# Patient Record
Sex: Female | Born: 1965 | Race: White | Hispanic: No | Smoking: Current every day smoker
Health system: Southern US, Community
[De-identification: ages and names within clinical notes are randomized; demographics above are authoritative.]

## PROBLEM LIST (undated history)

## (undated) DIAGNOSIS — E785 Hyperlipidemia, unspecified: Secondary | ICD-10-CM

## (undated) DIAGNOSIS — IMO0002 Reserved for concepts with insufficient information to code with codable children: Secondary | ICD-10-CM

## (undated) DIAGNOSIS — R102 Pelvic and perineal pain: Secondary | ICD-10-CM

## (undated) DIAGNOSIS — G8929 Other chronic pain: Secondary | ICD-10-CM

## (undated) DIAGNOSIS — Z8719 Personal history of other diseases of the digestive system: Secondary | ICD-10-CM

## (undated) DIAGNOSIS — R32 Unspecified urinary incontinence: Secondary | ICD-10-CM

## (undated) DIAGNOSIS — M545 Low back pain, unspecified: Secondary | ICD-10-CM

## (undated) DIAGNOSIS — Z8679 Personal history of other diseases of the circulatory system: Secondary | ICD-10-CM

## (undated) DIAGNOSIS — Q600 Renal agenesis, unilateral: Secondary | ICD-10-CM

## (undated) DIAGNOSIS — F329 Major depressive disorder, single episode, unspecified: Secondary | ICD-10-CM

## (undated) DIAGNOSIS — I493 Ventricular premature depolarization: Secondary | ICD-10-CM

## (undated) DIAGNOSIS — Z872 Personal history of diseases of the skin and subcutaneous tissue: Secondary | ICD-10-CM

## (undated) DIAGNOSIS — M48061 Spinal stenosis, lumbar region without neurogenic claudication: Secondary | ICD-10-CM

## (undated) DIAGNOSIS — Z8711 Personal history of peptic ulcer disease: Secondary | ICD-10-CM

## (undated) DIAGNOSIS — K5909 Other constipation: Secondary | ICD-10-CM

## (undated) DIAGNOSIS — F32A Depression, unspecified: Secondary | ICD-10-CM

## (undated) DIAGNOSIS — F419 Anxiety disorder, unspecified: Secondary | ICD-10-CM

## (undated) DIAGNOSIS — R350 Frequency of micturition: Secondary | ICD-10-CM

## (undated) HISTORY — DX: Major depressive disorder, single episode, unspecified: F32.9

## (undated) HISTORY — DX: Low back pain: M54.5

## (undated) HISTORY — DX: Depression, unspecified: F32.A

## (undated) HISTORY — DX: Low back pain, unspecified: M54.50

## (undated) HISTORY — DX: Unspecified urinary incontinence: R32

## (undated) HISTORY — PX: TONSILLECTOMY AND ADENOIDECTOMY: SUR1326

## (undated) HISTORY — DX: Reserved for concepts with insufficient information to code with codable children: IMO0002

## (undated) HISTORY — DX: Other chronic pain: G89.29

## (undated) HISTORY — DX: Anxiety disorder, unspecified: F41.9

## (undated) HISTORY — DX: Renal agenesis, unilateral: Q60.0

---

## 1987-09-20 HISTORY — PX: TUBAL LIGATION: SHX77

## 1995-09-20 HISTORY — PX: BREAST ENHANCEMENT SURGERY: SHX7

## 1996-09-19 HISTORY — PX: NEPHRECTOMY: SHX65

## 1998-02-27 ENCOUNTER — Inpatient Hospital Stay (HOSPITAL_COMMUNITY): Admission: EM | Admit: 1998-02-27 | Discharge: 1998-03-03 | Payer: Self-pay | Admitting: Specialist

## 1998-09-12 ENCOUNTER — Emergency Department (HOSPITAL_COMMUNITY): Admission: EM | Admit: 1998-09-12 | Discharge: 1998-09-12 | Payer: Self-pay | Admitting: Emergency Medicine

## 2002-05-17 ENCOUNTER — Other Ambulatory Visit: Admission: RE | Admit: 2002-05-17 | Discharge: 2002-05-17 | Payer: Self-pay | Admitting: Family Medicine

## 2003-05-16 ENCOUNTER — Ambulatory Visit (HOSPITAL_BASED_OUTPATIENT_CLINIC_OR_DEPARTMENT_OTHER): Admission: RE | Admit: 2003-05-16 | Discharge: 2003-05-16 | Payer: Self-pay | Admitting: Urology

## 2003-05-16 HISTORY — PX: OTHER SURGICAL HISTORY: SHX169

## 2003-08-08 ENCOUNTER — Ambulatory Visit (HOSPITAL_BASED_OUTPATIENT_CLINIC_OR_DEPARTMENT_OTHER): Admission: RE | Admit: 2003-08-08 | Discharge: 2003-08-08 | Payer: Self-pay | Admitting: Urology

## 2003-08-08 HISTORY — PX: OTHER SURGICAL HISTORY: SHX169

## 2006-07-14 ENCOUNTER — Emergency Department (HOSPITAL_COMMUNITY): Admission: EM | Admit: 2006-07-14 | Discharge: 2006-07-14 | Payer: Self-pay | Admitting: Emergency Medicine

## 2007-02-13 ENCOUNTER — Encounter (INDEPENDENT_AMBULATORY_CARE_PROVIDER_SITE_OTHER): Payer: Self-pay | Admitting: Obstetrics and Gynecology

## 2007-02-13 HISTORY — PX: LAPAROSCOPIC ASSISTED VAGINAL HYSTERECTOMY: SHX5398

## 2007-02-14 ENCOUNTER — Inpatient Hospital Stay (HOSPITAL_COMMUNITY): Admission: RE | Admit: 2007-02-14 | Discharge: 2007-02-17 | Payer: Self-pay | Admitting: Obstetrics and Gynecology

## 2007-03-01 ENCOUNTER — Inpatient Hospital Stay (HOSPITAL_COMMUNITY): Admission: AD | Admit: 2007-03-01 | Discharge: 2007-03-02 | Payer: Self-pay | Admitting: Obstetrics and Gynecology

## 2007-09-20 HISTORY — PX: ANTERIOR CRUCIATE LIGAMENT REPAIR: SHX115

## 2008-06-12 ENCOUNTER — Emergency Department (HOSPITAL_COMMUNITY): Admission: EM | Admit: 2008-06-12 | Discharge: 2008-06-12 | Payer: Self-pay | Admitting: Family Medicine

## 2008-07-08 ENCOUNTER — Emergency Department (HOSPITAL_COMMUNITY): Admission: EM | Admit: 2008-07-08 | Discharge: 2008-07-08 | Payer: Self-pay | Admitting: Family Medicine

## 2008-07-15 ENCOUNTER — Emergency Department (HOSPITAL_COMMUNITY): Admission: EM | Admit: 2008-07-15 | Discharge: 2008-07-15 | Payer: Self-pay | Admitting: Emergency Medicine

## 2008-08-19 ENCOUNTER — Emergency Department (HOSPITAL_COMMUNITY): Admission: EM | Admit: 2008-08-19 | Discharge: 2008-08-19 | Payer: Self-pay | Admitting: Family Medicine

## 2009-12-28 ENCOUNTER — Emergency Department (HOSPITAL_BASED_OUTPATIENT_CLINIC_OR_DEPARTMENT_OTHER): Admission: EM | Admit: 2009-12-28 | Discharge: 2009-12-28 | Payer: Self-pay | Admitting: Emergency Medicine

## 2010-04-22 ENCOUNTER — Encounter: Admission: RE | Admit: 2010-04-22 | Discharge: 2010-04-22 | Payer: Self-pay | Admitting: Orthopedic Surgery

## 2011-02-01 NOTE — Discharge Summary (Signed)
Tracy [name], Tracy [name]                 ACCOUNT NO.:  [id number]   MEDICAL RECORD NO.:  [id number]          PATIENT TYPE:  INP   LOCATION:  9306                          FACILITY:  [facility]   PHYSICIAN:  Juluis Mire, M.D.   DATE OF BIRTH:  04-17-1966   DATE OF ADMISSION:  02/28/2007  DATE OF DISCHARGE:  03/02/2007                               DISCHARGE SUMMARY   ADMISSION DIAGNOSES:  Status post laparoscopically assisted vaginal  hysterectomy, anterior/posterior repair and sling procedure with  increasing pelvic discomfort.   DISCHARGE DIAGNOSES:  Status post laparoscopically assisted vaginal  hysterectomy, anterior/posterior repair and sling procedure with  increasing pelvic discomfort, with probably just normal postoperative  discomfort.   For complete history and physical, please see written note.   COURSE IN THE HOSPITAL:  The patient admitted and begun on analgesics.  Her white count was 9300, hemoglobin 10.4.  She was reexamined on the  12th by Dr. Jennette Kettle, found no evidence of hematoma.  Felt this was probably  just normal postoperative discomfort.  On the 13th, she was doing much  better.  She was tolerating a regular diet and ambulating without  difficulty.  She was voiding without difficulty, passing flatus and exam  was not performed.  The patient will be discharged home at this time.  In terms of complication, none were encountered during the stay in the  hospital.  Discharged home in stable condition.   DISPOSITION:  Routine postoperative instructions given.  She is to avoid  heavy lifting, vaginal entrance or driving a car. Watch for signs of  infection, nausea, vomiting, increasing bleeding or increasing pelvic  pain.  She will be followed up Monday in the office by Dr. Vincente Poli.      Juluis Mire, M.D.  Electronically Signed     JSM/MEDQ  D:  03/02/2007  T:  03/02/2007  Job:  [id number]

## 2011-02-04 NOTE — Discharge Summary (Signed)
Tracy [name], Tracy [name]                 ACCOUNT NO.:  [id number]   MEDICAL RECORD NO.:  [id number]          PATIENT TYPE:  INP   LOCATION:  9310                          FACILITY:  [facility]   PHYSICIAN:  Michelle L. Grewal, M.D.DATE OF BIRTH:  12-17-1965   DATE OF ADMISSION:  02/13/2007  DATE OF DISCHARGE:  02/17/2007                               DISCHARGE SUMMARY   ADMISSION DIAGNOSIS:  Uterine prolapse, cystocele with symptomatic  rectocele and stress urinary incontinence.   DISCHARGE DIAGNOSIS:  Uterine prolapse, cystocele with symptomatic  rectocele and stress urinary incontinence.   HISTORY OF PRESENT ILLNESS:  The patient is a 45 year old female who was  admitted with a diagnosis of pelvic prolapse and stress urinary  incontinence.   HOSPITAL COURSE:  She underwent on the day of admission and LAVH and  posterior repair. Martina Sinner, M.D. performed a cystoscopy and  cystocele repair with pubovaginal sling. At surgery EBL was 400 mL.  The  patient did well postop.  On postop day #1 she was ambulating.  She had  fair pain control.  The patient's vital signs were stable.  By postop  day #2 she was having to do in-and-out catheterization because of some  urinary retention and that was being managed by Dr. Sherron Monday. She was  using sitz baths for her perineal pain.  She was having some problem  with abdominal distension by postop day #2 and per her report she states  that she has problems with chronic constipation. Sometimes she can go 1-  2 weeks without a bowel movement.  We tried milk of magnesia, we tried  some Dulcolax suppository, we also tried treating her nausea with  antiemetics. I felt that the nausea was secondary to her distension.  Every time she would eat, she would become slightly distended.  She did  maintain great bowel sounds, however by Feb 16, 2007, I did perform a  KUB because of persistent distension.  The radiologist reviewed the film  which showed minimal  ileus but a lot of stool throughout the entire  length of the colon. We did several Fleet's enema and finally a soapsuds  enema on Feb 17, 2007 performed revealed good results. Her distension  was resolved by Feb 17, 2007, that was postop day #4. She remained  afebrile with stable vital signs.  She was discharged home on postop day  #4 feeling much better.  She received all of her catheter instructions  by Dr. Sherron Monday and she will follow up in my office in 1-2 weeks.  She  was advised to call if she has a return abdominal distension, nausea,  vomiting, temperature greater than 100.5 or heavy vaginal bleeding.      Michelle L. Vincente Poli, M.D.  Electronically Signed     MLG/MEDQ  D:  04/09/2007  T:  04/09/2007  Job:  [id number]

## 2011-02-04 NOTE — Op Note (Signed)
Tracy [name], Tracy [name]                 ACCOUNT NO.:  [id number]   MEDICAL RECORD NO.:  [id number]          PATIENT TYPE:  AMB   LOCATION:  SDC                           FACILITY:  [facility]   PHYSICIAN:  Michelle L. Grewal, M.D.DATE OF BIRTH:  Jan 28, 1966   DATE OF PROCEDURE:  02/13/2007  DATE OF DISCHARGE:                               OPERATIVE REPORT   PREOP DIAGNOSES:  1. Pelvic prolapse.  2. Uterine prolapse.  3. Cystocele.  4. Rectocele.  5. Stress urinary incontinence.   POSTOP DIAGNOSES:  1. Pelvic prolapse.  2. Uterine prolapse.  3. Cystocele.  4. Rectocele.  5. Stress urinary incontinence.   PROCEDURE:  1. Laparoscopically assisted vaginal hysterectomy.  2. Posterior repair.  3. Anterior repair.  4. Cystoscopy.  5. Anterior vaginal sling.   SURGEONS:  Michelle L. Vincente Poli, M.D.  Martina Sinner, MD   ANESTHESIA:  General.   SPECIMENS:  Uterus and cervix.   PROCEDURE:  The patient was taken to the operating room after informed  consent was obtained.  She was consented prior to surgery on the known  and accepted risks of surgery included risks of anesthesia, risks of  infection, risks of internal injury to internal organs, risks of  excessive bleeding, risks of pulmonary embolism, and venous thrombus  embolism.  She was intubated.  She was prepped and draped in the usual  fashion and a Foley catheter was inserted.  A small infraumbilical  incision was made through umbilicus.  The Veress needle was inserted and  pneumoperitoneum was performed.  The Veress needle was removed and the  11 mm trocar was inserted.  The laparoscope was introduced through the  trocar sheet.  The 5 mm suprapubic port was placed under direct  visualization.  The patient was noted to be status post bilateral tubal  ligation.  Her ovaries appeared normal.  The uterus appeared normal.  There were no pelvic adhesions.  I then placed the [facility] instrument  across the triple pedicle on each side  and burned that and carried it  down to the round ligament.  This was done on both sides with no  bleeding.  At this point we then removed the trocar laparoscope and  released the pneumoperitoneum.  I did keep the two trocars in which were  used at the end of the case.  Then attention was turned to the vagina.  A weighted speculum was placed into the vagina and circumferential  incision was made around the cervix.  The posterior cul-de-sac was  entered sharply using Mayo scissors and the anterior cul-de-sac was  entered sharply using Mayo scissors.  Using curved Heaney clamps we  clamped the uterosacral cardinal ligaments on either side.  Each pedicle  were suture ligated using 0 Vicryl suture.  We walked our way up the  broad ligament staying just beside the uterus and cervix.  Each pedicle  was suture ligated using 0 Vicryl suture.  Once we reached the level of  the uterine fundus the uterus was retroflexed.  The remainder of the  broad ligament was clamped on either  side and the specimen was removed.  The pedicles were secured using a suture ligated of 0 Vicryl suture.  At  this point I then placed a McCall stitch in the cul-de-sac using 0  Vicryl suture and then closed the posterior two-thirds of the cuff using  2-0 Vicryl in a running locked stitch.  At this point my assistant, Dr.  Sherron Monday, took over as the primary surgeon and he performed an  anterior repair and cystoscopy and pubovaginal sling.  That will be  dictated by him.  At the end he completed closing the vaginal cuff  completely.  We then switched places and I became primary surgeon again  and at this point I performed the posterior repair by placing the Allis  clamps at the five and seven o'clock position on the perineum making  V  shape incision using the scalpel and making a midline incision all the  way up the posterior vaginal wall using Metzenbaum scissors.  The  rectocele was reduced using sharp and blunt  dissection, and a Vicryl  suture interrupted to reduce the rectocele.  The redundant vaginal  epithelium was closed and then the vagina was then closed in a  continuous running locked sutures in 2-0 Vicryl.  The perineum was  closed as well using subcuticular stitch.  At the end of the procedure a  vaginal packing was placed into the vagina.  At this point I then  changed my gloves and then went back up to the abdomen where the  pneumoperitoneum was then performed again and irrigated the pelvis.  There was no bleeding whatsoever.  I did notice that her vaginal cuff  did appear quite raw and the bowel seemed to want to sit on the vaginal  cuff.  I did place a piece of Interceed along the vaginal cuff to  hopefully prevent adhesions in the future.  At the end of the procedure  the pneumoperitoneum was released.  The skin incisions were closed with  Dermabond.  All sponge, lap, and instrument counts were correct times  two.  The patient went to recovery room in stable condition.      Michelle L. Vincente Poli, M.D.  Electronically Signed     MLG/MEDQ  D:  02/13/2007  T:  02/13/2007  Job:  [id number]   cc:   Martina Sinner, MD  Fax: [phone]

## 2011-02-04 NOTE — Op Note (Signed)
NAME:  [name], [name]                          ACCOUNT NO.:  [id number]   MEDICAL RECORD NO.:  [id number]                   PATIENT TYPE:  AMB   LOCATION:  NESC                                 FACILITY:  [facility]   PHYSICIAN:  Ronald L. Ovidio Hanger, M.D.           DATE OF BIRTH:  [birth year]   DATE OF PROCEDURE:  08/08/2003  DATE OF DISCHARGE:                                 OPERATIVE REPORT   DIAGNOSIS:  Vaginal erosion TVT.   OPERATIVE PROCEDURE:  Revision TVT.   SURGEON:  Lucrezia Starch. Earlene Plater, M.D.   ANESTHESIA:  LMA.   ESTIMATED BLOOD LOSS:  Negligible.   TUBES:  None.   COMPLICATIONS:  None.   INDICATIONS FOR PROCEDURE:  [name] is a lovely 45 year old white female,  who had stress incontinence, underwent TVT procedure.  She did well;  however, there was a slight erosion, and the TVT could be palpated  vaginally, just one margin of it.  It has been somewhat uncomfortable,  although it has been very functional but with the slight discomfort  associated with the palpable edge of the TVT sling, she has elected to  proceed with excision of the palpable sling.   PROCEDURE IN DETAIL:  The patient was placed in supine position.  After  proper LMA anesthesia, was placed in the dorsal lithotomy position and  prepped and draped with Betadine in a sterile fashion.  A 16 French Foley  catheter was inserted in the bladder, and the bladder was drained.  The edge  of the TVT was grasped, could barely be seen coming through the vaginal  mucosa.  It was dissected at approximately, and approximately a 1 cm section  of it was excised, maintaining the lateral margins of it.  There It was a  quite thick vascularized flap noted over it.  Thorough irrigation was  performed, good hemostasis noted to be present.  There was no further  palpable TVT, and the vaginal flap was closed with a running 2-0 Vicryl  suture horizontally.  The wound was irrigated, and the patient was taken to  the recovery room  stable, and the Foley catheter was removed.                                               Ronald L. Ovidio Hanger, M.D.    RLD/MEDQ  D:  08/08/2003  T:  08/08/2003  Job:  [id number]

## 2011-02-04 NOTE — Op Note (Signed)
NAME:  Tracy [name], Tracy [name]                          ACCOUNT NO.:  [id number]   MEDICAL RECORD NO.:  [id number]                   PATIENT TYPE:  AMB   LOCATION:  NESC                                 FACILITY:  [facility]   PHYSICIAN:  Ronald L. Ovidio Hanger, M.D.           DATE OF BIRTH:  [birth year]   DATE OF PROCEDURE:  05/16/2003  DATE OF DISCHARGE:                                 OPERATIVE REPORT   PREOPERATIVE DIAGNOSIS:  Stress urinary incontinence type 1 and type 3.   POSTOPERATIVE DIAGNOSIS:  Stress urinary incontinence type 1 and type 3.   OPERATION:  TVT procedure and cystourethroscopy.   SURGEON:  Lucrezia Starch. Earlene Plater, M.D.   ANESTHESIA:  LMA.   ESTIMATED BLOOD LOSS:  50 mL   TUBES:  None.   COMPLICATIONS:  None.   INDICATIONS FOR PROCEDURE:  Neya is a lovely 45 year old white female who  essentially presented with urinary incontinence. She was found on workup to  have some descensus but not below the inferior border of the pubic symphysis  and on urodynamic evaluation was noted to have stable urodynamics but had a  leak point pressure of 100-110 cm of water. We considered options carefully  and after understanding the risks, benefits, and alternatives, she elected  to proceed with a sling TVT.   DESCRIPTION OF PROCEDURE:  The patient was placed in supine position and  after proper LMA anesthesia was placed in the dorsal lithotomy position and  prepped and draped with Betadine in a sterile fashion. A Deaver retractor  was placed into the vaginal area and a 16 French Foley catheter was placed  in the bladder and the bladder was drained. The vaginal submucosa was  injected with approximately 8 mL of 1% Xylocaine with epinephrine. A 1 cm  midline incision was made at the urethrovesical junction and vaginal flaps  were created bilaterally, the endopelvic fascia was not perforated. Two  punch holes were then obtained one fingerbreadth superior and two  fingerbreadths lateral to the  midline pubic symphysis and utilizing the TVT  needles these were punctured and delivered into through the endopelvic  fascia to the fingertip and delivered periurethrally bilaterally.  Cystourethroscopy was then performed and utilizing both the 12 and 70 degree  lenses, the bladder was carefully inspected and efflux of clear urine was  noted from the solitary functioning ureteral orifice although both orifices  were in normal position and no perforation was noted.  No lesions were  noted. The bladder was drained. The TVT tape was then placed with the  __________ instrument and cystourethroscopy was performed in a similar  manner again, and again no perforation was noted. The taped sheaths were  removed. The tape was placed so that it completely deployed but did not  constrict and was not unduly loose. A hemostat could be placed behind it,  and again  cystourethroscopy revealed no perforations. The bladder was then  drained,  the vaginal mucosa was closed with a running 3-0 Vicryl suture. The tape  were cut and the punch holes were glued with Tegaderm. A vaginal pack was  placed utilizing a vaginal pack with bacitracin ointment and the patient was  taken to the recovery room stable.                                               Ronald L. Ovidio Hanger, M.D.    RLD/MEDQ  D:  05/16/2003  T:  05/16/2003  Job:  [id number]

## 2011-02-04 NOTE — Op Note (Signed)
NAMEMARLEEN, Tracy [name]                 ACCOUNT NO.:  [id number]   MEDICAL RECORD NO.:  [id number]          PATIENT TYPE:  AMB   LOCATION:  SDC                           FACILITY:  [facility]   PHYSICIAN:  Martina Sinner, MD DATE OF BIRTH:  1965/12/30   DATE OF PROCEDURE:  02/13/2007  DATE OF DISCHARGE:                               OPERATIVE REPORT   SURGEON:  Martina Sinner, MD   ASSISTANT:  Michelle L. Vincente Poli, M.D.   PREOPERATIVE DIAGNOSES:  1. Stress urinary incontinence.  2. Cystocele.  3. Rectocele  4. Mild loss of uterine support.   OPERATION:  Sling, cystourethropexy plus cystocele repair (with  rectocele repair and laparoscopic assisted transvaginal hysterectomy  performed by Dr. Marcelle Overlie)   Tracy [name] had symptomatic prolapse with stress urge incontinence.  She  consented to a sling plus cystocele repair in combination with rectocele  repair and hysterectomy.   Dr. Vincente Poli performed a laparoscopic-assisted transvaginal hysterectomy.   DESCRIPTION OF PROCEDURE:  Extra care was taken to position the  patient's legs to minimize the risk of compartment syndrome, neuropathy  and DVT.  Preoperative lab work was normal.  She was given preoperative  antibiotics.   Following the laparoscopic-assisted transvaginal hysterectomy I  performed an anterior repair.  Allis clamps were placed on the cuff.  I  opened the anterior vaginal wall between two Allis clamps and divided  down to approximately 1 cm proximal to the urethral meatus, sharply  dissected the vaginal wall mucosa from the pubocervical fascia.  I  dissected back nearly to the white line bilaterally.  I left thick  pubocervical fascia overlying urethra to prevent extrusion.  I dissected  laterally near the urethrovesical angle.  I did an anterior repair with  two layers of 2-0 Vicryl.  She only had a moderate-sized grade 2  cystocele with minimal lateral defect and a mild central defect.  The  second layer was  really just to strengthen the first layer.  I did not  imbricate the bladder neck.   After the cystocele repair I went on to perform the sling.  I made two  1.5 cm incisions 1.5 cm lateral to the midline one fingerbreadth above  the symphysis pubis.  I delivered down with the bladder empty, the SPARC  needle on top of and along the back of the pubic bone under the pulp of  my left index finger bilaterally.  I cystoscoped the patient.  There was  no injury to the bladder or urethra.  I had cystoscoped her after the  anterior repair and hysterectomy and she had efflux of indigo carmine  from her left ureteral orifice.  She has had a right nephrectomy.  There  was still efflux of indigo carmine from the left ureteral orifice  following the sling.  There was no deflection on the bladder when I  wiggled the needles.   With the bladder empty I attached the [facility] sling and brought it up  through the retropubic space with the sheath in place.  I tensioned over  the fat part of a  Kelly clamp using my usual technique.  I cut below the  blue dots and removed the sheath after irrigating the sheath with  saline.  The sling sat nicely and flat at the at the level of the mid  urethra.  I could hypermobilize in the midline and as well as laterally.  I was happy with the tension and position of the sling.  I wanted to err  on the side of looser than tighter in her case.   I trimmed approximately 3 mm of vaginal wall mucosa bilaterally, more  cephalad.  I did not trim near the urethrovesical angle.  I closed the  fairly thick vaginal wall and pubocervical fascia with running 2-0  Vicryl on a CT-1.  I cut the sling at the level of the skin after depressing the skin.  I  closed the abdominal skin with a 4-0 Vicryl subcuticular followed by  Dermabond.  Foley catheter had been reinserted and was draining well.   The posterior repair was then performed by Dr. Marcelle Overlie.   I am hopeful that Mrs.  Bena's cystocele repair and sling will reach her  treatment goal.           ______________________________  Martina Sinner, MD  Electronically Signed     SAM/MEDQ  D:  02/13/2007  T:  02/13/2007  Job:  [id number]

## 2011-06-20 LAB — WET PREP, GENITAL
Trich, Wet Prep: NONE SEEN
WBC, Wet Prep HPF POC: NONE SEEN
Yeast Wet Prep HPF POC: NONE SEEN
Yeast Wet Prep HPF POC: NONE SEEN

## 2011-06-20 LAB — POCT URINALYSIS DIP (DEVICE)
Bilirubin Urine: NEGATIVE
Glucose, UA: NEGATIVE
Hgb urine dipstick: NEGATIVE
Ketones, ur: NEGATIVE
Nitrite: NEGATIVE
Operator id: 200941
Protein, ur: NEGATIVE
Protein, ur: NEGATIVE
Specific Gravity, Urine: 1.01
Urobilinogen, UA: 0.2
pH: 6.5

## 2011-06-20 LAB — URINE CULTURE

## 2011-07-07 LAB — COMPREHENSIVE METABOLIC PANEL
ALT: 17
AST: 20
Albumin: 3.3 — ABNORMAL LOW
Alkaline Phosphatase: 75
BUN: 8
GFR calc Af Amer: >60
Potassium: 3.7
Sodium: 139
Total Protein: 5.7 — ABNORMAL LOW

## 2011-07-07 LAB — URINALYSIS, ROUTINE W REFLEX MICROSCOPIC
Ketones, ur: 15 — AB
Nitrite: NEGATIVE
Protein, ur: NEGATIVE
Urobilinogen, UA: 0.2

## 2011-07-07 LAB — DIFFERENTIAL
Basophils Relative: 0
Eosinophils Relative: 3
Monocytes Absolute: 0.8 — ABNORMAL HIGH
Monocytes Relative: 8
Neutro Abs: 5.5

## 2011-07-07 LAB — CBC
Platelets: 407 — ABNORMAL HIGH
RDW: 12.6
WBC: 9.3

## 2011-11-30 ENCOUNTER — Encounter (HOSPITAL_COMMUNITY): Payer: Self-pay

## 2011-11-30 ENCOUNTER — Emergency Department (HOSPITAL_COMMUNITY)
Admission: EM | Admit: 2011-11-30 | Discharge: 2011-11-30 | Disposition: A | Discharge status: Home / Self Care | Payer: Self-pay | Source: Home / Self Care | Attending: Emergency Medicine | Admitting: Emergency Medicine

## 2011-11-30 DIAGNOSIS — B9689 Other specified bacterial agents as the cause of diseases classified elsewhere: Secondary | ICD-10-CM

## 2011-11-30 DIAGNOSIS — A499 Bacterial infection, unspecified: Secondary | ICD-10-CM

## 2011-11-30 DIAGNOSIS — I059 Rheumatic mitral valve disease, unspecified: Secondary | ICD-10-CM

## 2011-11-30 DIAGNOSIS — J019 Acute sinusitis, unspecified: Secondary | ICD-10-CM

## 2011-11-30 DIAGNOSIS — N760 Acute vaginitis: Secondary | ICD-10-CM

## 2011-11-30 DIAGNOSIS — J309 Allergic rhinitis, unspecified: Secondary | ICD-10-CM

## 2011-11-30 DIAGNOSIS — N3000 Acute cystitis without hematuria: Secondary | ICD-10-CM

## 2011-11-30 DIAGNOSIS — I341 Nonrheumatic mitral (valve) prolapse: Secondary | ICD-10-CM

## 2011-11-30 LAB — POCT URINALYSIS DIP (DEVICE)
Ketones, ur: NEGATIVE mg/dL
Leukocytes, UA: NEGATIVE
Protein, ur: NEGATIVE mg/dL
pH: 7 (ref 5.0–8.0)

## 2011-11-30 LAB — WET PREP, GENITAL: Trich, Wet Prep: NONE SEEN

## 2011-11-30 MED ORDER — PREDNISONE 5 MG PO KIT: 1.0000 | PACK | Freq: Every day | ORAL | Status: DC

## 2011-11-30 MED ORDER — METOPROLOL TARTRATE 50 MG PO TABS: 25.0000 mg | ORAL_TABLET | Freq: Every day | ORAL | Status: AC

## 2011-11-30 MED ORDER — CEPHALEXIN 500 MG PO CAPS: 500.0000 mg | ORAL_CAPSULE | Freq: Three times a day (TID) | ORAL | Status: AC

## 2011-11-30 MED ORDER — FLUCONAZOLE 150 MG PO TABS: 150.0000 mg | ORAL_TABLET | Freq: Once | ORAL | Status: AC

## 2011-11-30 MED ORDER — METRONIDAZOLE 500 MG PO TABS: 500.0000 mg | ORAL_TABLET | Freq: Two times a day (BID) | ORAL | Status: AC

## 2011-11-30 NOTE — ED Provider Notes (Signed)
Chief Complaint  Patient presents with  . Vaginitis    History of Present Illness:   Tracy [name] is a 46 year old female who is in today for several complaints: Vaginal discharge, nasal congestion, and frequent urination.  She has had a two-week history of vaginal discharge with odor and only slight itching. She has mild pelvic pain. She has a history of bacterial vaginosis. She's status post hysterectomy. She denies any bleeding.  She also has had a two-week history of nasal congestion with yellow-green drainage, sore throat, earache, sinus pressure, headache, and fever up to 101. She has a history of springtime allergies. She also complains of sneezing and itching of the nose. She's been using some outdated Flonase without much improvement.  She also has had a three-day history of urinary frequency and urgency. She denies any dysuria or hematuria. She does have a history of many urinary tract infections in the past.  She also requests a refill on her metoprolol which she takes for mitral valve prolapse. She takes 50 mg tablets one half per day.  Review of Systems:  Other than noted above, the patient denies any of the following symptoms: Systemic:  No fever, chills, sweats, fatigue, or weight loss. GI:  No abdominal pain, nausea, anorexia, vomiting, diarrhea, constipation, melena or hematochezia. GU:  No dysuria, frequency, urgency, hematuria, vaginal discharge, itching, or abnormal vaginal bleeding. Skin:  No rash or itching.   PMFSH:  Past medical history, family history, social history, meds, and allergies were reviewed.  Physical Exam:   Vital signs:  BP 112/72  Pulse 78  Temp(Src) 98.8 F (37.1 C) (Oral)  Resp 19  SpO2 97% General:  Alert, oriented and in no distress. ENT: TMs and canals are normal. Nasal mucosa was congested particularly on the right with a small amount of yellowish drainage. The pharynx was clear. Lungs:  Breath sounds clear and equal bilaterally.  No wheezes,  rales or rhonchi. Heart:  Regular rhythm.  No gallops or murmers. Abdomen:  Soft, flat and non-distended.  No organomegaly or mass.  No tenderness, guarding or rebound.  Bowel sounds normally active. Pelvic exam:  External genitalia unremarkable. Vaginal mucosa was normal with a small amount of homogeneous, white, malodorous discharge. Cervix and uterus were surgically absent. No adnexal masses. She has moderate bilateral adnexal tenderness. Skin:  Clear, warm and dry.  Labs:   Results for orders placed during the hospital encounter of 11/30/11  POCT URINALYSIS DIP (DEVICE)      Component Value Range   Glucose, UA NEGATIVE  NEGATIVE (mg/dL)   Bilirubin Urine NEGATIVE  NEGATIVE    Ketones, ur NEGATIVE  NEGATIVE (mg/dL)   Specific Gravity, Urine 1.020  1.005 - 1.030    Hgb urine dipstick NEGATIVE  NEGATIVE    pH 7.0  5.0 - 8.0    Protein, ur NEGATIVE  NEGATIVE (mg/dL)   Urobilinogen, UA 0.2  0.0 - 1.0 (mg/dL)   Nitrite NEGATIVE  NEGATIVE    Leukocytes, UA NEGATIVE  NEGATIVE      Assessment:   Diagnoses that have been ruled out:  None  Diagnoses that are still under consideration:  None  Final diagnoses:  Bacterial vaginosis  Allergic rhinitis  Acute sinusitis  Acute cystitis  Mitral valve prolapse    Plan:   1.  The following meds were prescribed:   New Prescriptions   CEPHALEXIN (KEFLEX) 500 MG CAPSULE    Take 1 capsule (500 mg total) by mouth 3 (three) times daily.   FLUCONAZOLE (  DIFLUCAN) 150 MG TABLET    Take 1 tablet (150 mg total) by mouth once.   METOPROLOL (LOPRESSOR) 50 MG TABLET    Take 0.5 tablets (25 mg total) by mouth daily.   METRONIDAZOLE (FLAGYL) 500 MG TABLET    Take 1 tablet (500 mg total) by mouth 2 (two) times daily.   PREDNISONE 5 MG KIT    Take 1 kit (5 mg total) by mouth daily after breakfast. Prednisone 5 mg 6 day dosepack.  Take as directed.   2.  The patient was instructed in symptomatic care and handouts were given. 3.  The patient was told to  return if becoming worse in any way, if no better in 3 or 4 days, and given some red flag symptoms that would indicate earlier return.    Reuben Likes, MD 11/30/11 [phone]

## 2011-11-30 NOTE — ED Notes (Signed)
C/o bacterial infection for 1 month +, and URI type syx for similar time frame; NAD

## 2011-11-30 NOTE — Discharge Instructions (Signed)
Allergic Rhinitis Allergic rhinitis is when the mucous membranes in the nose respond to allergens. Allergens are particles in the air that cause your body to have an allergic reaction. This causes you to release allergic antibodies. Through a chain of events, these eventually cause you to release histamine into the blood stream (hence the use of antihistamines). Although meant to be protective to the body, it is this release that causes your discomfort, such as frequent sneezing, congestion and an itchy runny nose.  CAUSES  The pollen allergens may come from grasses, trees, and weeds. This is seasonal allergic rhinitis, or "hay fever." Other allergens cause year-round allergic rhinitis (perennial allergic rhinitis) such as house dust mite allergen, pet dander and mold spores.  SYMPTOMS   Nasal stuffiness (congestion).   Runny, itchy nose with sneezing and tearing of the eyes.   There is often an itching of the mouth, eyes and ears.  It cannot be cured, but it can be controlled with medications. DIAGNOSIS  If you are unable to determine the offending allergen, skin or blood testing may find it. TREATMENT   Avoid the allergen.   Medications and allergy shots (immunotherapy) can help.   Hay fever may often be treated with antihistamines in pill or nasal spray forms. Antihistamines block the effects of histamine. There are over-the-counter medicines that may help with nasal congestion and swelling around the eyes. Check with your caregiver before taking or giving this medicine.  If the treatment above does not work, there are many new medications your caregiver can prescribe. Stronger medications may be used if initial measures are ineffective. Desensitizing injections can be used if medications and avoidance fails. Desensitization is when a patient is given ongoing shots until the body becomes less sensitive to the allergen. Make sure you follow up with your caregiver if problems continue. SEEK  MEDICAL CARE IF:   You develop fever (more than 100.5 F (38.1 C).   You develop a cough that does not stop easily (persistent).   You have shortness of breath.   You start wheezing.   Symptoms interfere with normal daily activities.  Document Released: 05/31/2001 Document Revised: 08/25/2011 Document Reviewed: 12/10/2008 [facility] Patient Information 2012 [place], [place].Bacterial Vaginosis Bacterial vaginosis (BV) is a vaginal infection where the normal balance of bacteria in the vagina is disrupted. The normal balance is then replaced by an overgrowth of certain bacteria. There are several different kinds of bacteria that can cause BV. BV is the most common vaginal infection in women of childbearing age. CAUSES   The cause of BV is not fully understood. BV develops when there is an increase or imbalance of harmful bacteria.   Some activities or behaviors can upset the normal balance of bacteria in the vagina and put women at increased risk including:   Having a new sex partner or multiple sex partners.   Douching.   Using an intrauterine device (IUD) for contraception.   It is not clear what role sexual activity plays in the development of BV. However, women that have never had sexual intercourse are rarely infected with BV.  Women do not get BV from toilet seats, bedding, swimming pools or from touching objects around them.  SYMPTOMS   Grey vaginal discharge.   A fish-like odor with discharge, especially after sexual intercourse.   Itching or burning of the vagina and vulva.   Burning or pain with urination.   Some women have no signs or symptoms at all.  DIAGNOSIS  Your caregiver  must examine the vagina for signs of BV. Your caregiver will perform lab tests and look at the sample of vaginal fluid through a microscope. They will look for bacteria and abnormal cells (clue cells), a pH test higher than 4.5, and a positive amine test all associated with BV.  RISKS AND  COMPLICATIONS   Pelvic inflammatory disease (PID).   Infections following gynecology surgery.   Developing HIV.   Developing herpes virus.  TREATMENT  Sometimes BV will clear up without treatment. However, all women with symptoms of BV should be treated to avoid complications, especially if gynecology surgery is planned. Female partners generally do not need to be treated. However, BV may spread between female sex partners so treatment is helpful in preventing a recurrence of BV.   BV may be treated with antibiotics. The antibiotics come in either pill or vaginal cream forms. Either can be used with nonpregnant or pregnant women, but the recommended dosages differ. These antibiotics are not harmful to the baby.   BV can recur after treatment. If this happens, a second round of antibiotics will often be prescribed.   Treatment is important for pregnant women. If not treated, BV can cause a premature delivery, especially for a pregnant woman who had a premature birth in the past. All pregnant women who have symptoms of BV should be checked and treated.   For chronic reoccurrence of BV, treatment with a type of prescribed gel vaginally twice a week is helpful.  HOME CARE INSTRUCTIONS   Finish all medication as directed by your caregiver.   Do not have sex until treatment is completed.   Tell your sexual partner that you have a vaginal infection. They should see their caregiver and be treated if they have problems, such as a mild rash or itching.   Practice safe sex. Use condoms. Only have 1 sex partner.  PREVENTION  Basic prevention steps can help reduce the risk of upsetting the natural balance of bacteria in the vagina and developing BV:  Do not have sexual intercourse (be abstinent).   Do not douche.   Use all of the medicine prescribed for treatment of BV, even if the signs and symptoms go away.   Tell your sex partner if you have BV. That way, they can be treated, if needed, to  prevent reoccurrence.  SEEK MEDICAL CARE IF:   Your symptoms are not improving after 3 days of treatment.   You have increased discharge, pain, or fever.  MAKE SURE YOU:   Understand these instructions.   Will watch your condition.   Will get help right away if you are not doing well or get worse.  FOR MORE INFORMATION  Division of STD Prevention (DSTDP), Centers for Disease Control and Prevention: SolutionApps.co.za American Social Health Association (ASHA): www.ashastd.org  Document Released: 09/05/2005 Document Revised: 08/25/2011 Document Reviewed: 02/26/2009 [facility] Patient Information 2012 [place], [place].Sinusitis Sinuses are air pockets within the bones of your face. The growth of bacteria within a sinus leads to infection. The infection prevents the sinuses from draining. This infection is called sinusitis. SYMPTOMS  There will be different areas of pain depending on which sinuses have become infected.  The maxillary sinuses often produce pain beneath the eyes.   Frontal sinusitis may cause pain in the middle of the forehead and above the eyes.  Other problems (symptoms) include:  Toothaches.   Colored, pus-like (purulent) drainage from the nose.   Swelling, warmth, and tenderness over the sinus areas may be signs  of infection.  TREATMENT  Sinusitis is most often determined by an exam.X-rays may be taken. If x-rays have been taken, make sure you obtain your results or find out how you are to obtain them. Your caregiver may give you medications (antibiotics). These are medications that will help kill the bacteria causing the infection. You may also be given a medication (decongestant) that helps to reduce sinus swelling.  HOME CARE INSTRUCTIONS   Only take over-the-counter or prescription medicines for pain, discomfort, or fever as directed by your caregiver.   Drink extra fluids. Fluids help thin the mucus so your sinuses can drain more easily.   Applying either moist  heat or ice packs to the sinus areas may help relieve discomfort.   Use saline nasal sprays to help moisten your sinuses. The sprays can be found at your local drugstore.  SEEK IMMEDIATE MEDICAL CARE IF:  You have a fever.   You have increasing pain, severe headaches, or toothache.   You have nausea, vomiting, or drowsiness.   You develop unusual swelling around the face or trouble seeing.  MAKE SURE YOU:   Understand these instructions.   Will watch your condition.   Will get help right away if you are not doing well or get worse.  Document Released: 09/05/2005 Document Revised: 08/25/2011 Document Reviewed: 04/04/2007 [facility] Patient Information 2012 [place], [place].  How to Quit Smoking  According to the U.S. Surgeon General, about 440,000 people in the [place] alone die from complications related to tobacco use.  More deaths occur due to cigarette smoking than illegal drug use, AIDS, car accidents, alcohol-related deaths, suicide and homicide combined.  Smoking accounts for about 30% of all cancer related deaths, including more than 80% of lung cancer deaths. Smoking has also been linked as the cause of many other diseases like heart disease, bronchitis, emphysema, stroke, and complications of pneumonia as well as causing an increased risk of miscarriage, premature births, stillbirth, infant death, and low birth weight in infants.  For this reason, the U.S. Surgeon General recommends:  "Smoking cessation (stopping smoking) represents the single most important step that smokers can take to enhance the length and quality of their lives."  Why is it so hard to Quit?  Tobacco products contain Nicotine which is highly addictive - probably as addictive as heroin or cocaine.  Over time, your body becomes both physically and psychologically dependent on it. Finally, attempts to quit smoking are complicated by withdrawal reactions like depression, irritability, trouble sleeping,  trouble concentrating, restlessness, headaches, weight gain, and excessive fatigue as well as a lack of support.  These symptoms can last from a few days to several weeks.  Why should you Quit?  Live longer and healthier  Can improves the health of your housemates (children, spouse)  Increases your energy and breathing ability  Lowers risk of heart attack, stroke and cancer  Saves money - For example, if you smoke a pack of cigarettes a day and each pack costs about $3.00, then you will save about $1,100.00 per year, about $5,500. in 5 years and $11,000.00 in 10 years.  What you can do: 1. Talk to your health care provider - There are many smoking cessations aids available, both prescription or over-the-counter.  Check with your doctor and pharmacist before taking any of these products to see which one is best for you.  Develop a plan with your healthcare provider which may include nicotine replacement, prescription medication and/or counseling.  2. Get Started -  Loraine Leriche a start date on your calendar.  Remove cigarettes and ashtrays from your home, car, and office.  Don't be around other smokers.  Stop smoking.not even a puff!  3. Support - Talk to family, friends, and co-workers about your plan to stop smoking.  Ask them not to smoke around you.  4. Coping Strategies  The four "A"s to help during tough times:  a. Avoid - Avoid other smokers or places where smoking is commonplace.  Avoid alcoholic beverages as these may increase your desire to smoke b. Alter - Change your routine.  Drink water/juices instead of alcohol or coffee.  Take a walk or visit with someone during your coffee break.  Change your route to work. c. Alternatives - Substitute raw vegetables like carrot sticks or celery, sugarless candy or gum for the habit of having a cigarette. d. Activities - Start an exercise program (talk to your doctor prior to beginning any exercise program). Try out a new "hands on" hobby to  distract you from smoking and to keep your hands busy like woodworking or needlepoint.   Additional tips for specific withdrawal symptoms:   Cravings for tobacco:  -- Distract yourself       -- Deep-breathing exercises       -- Remember that cravings are brief    Irritability:   -- Take a few slow, deep breaths      -- Soak in a hot bath    Insomnia:   -- Take a walk several hours before bedtime      -- Avoid caffeinated beverages after noon      -- Read a book      -- Take a warm bath      -- Banana or warm milk    Increased appetite:  -- Drink water or low-calorie drinks      -- Make a survival Kit: Include straws, cinnamon        sticks,       -- coffee stirrers, licorice, toothpicks, gum, or fresh         vegetables    Inability to concentrate: -- Take a brisk walk      -- Lighten your schedule for a couple of days      -- Take more breaks   Fatigue:   -- Get a good night's sleep      -- Take a nap      -- Don't overdo it for 2-4 weeks    Constipation, gas,  stomach pain:   -- Drink plenty of fluids      -- Increase fiber: fruit, raw vegetables, whole grain        cereals      -- Talk to your doctor about diet changes    5. Dealing with Relapses - Most relapses occur within the first 2-3 months.  This is common so don't be discouraged.  Some people may take several attempts before they can quit smoking completely.  6. Reward yourself - Set-up a rewards program for every milestone, like 1st month after quitting, 3rd month after quitting, and 6th month after quitting to keep you motivated.  What your doctor can do: Perform a physical exam and order diagnostic tests like laboratory blood work and a chest X-ray.  This will help to identify health related conditions that might benefit from smoking cessation. Review your health history to make sure there are no contraindications with specific smoking cessation aids like allergies to medications or ingredients in  these  medications or conflicts with your current medications. Prescribe smoking cessation aids such as: Over-the-counter aid:  Nicotine gum, Nicotine patch Prescription aids:  Nicotine spray, Nicotine inhaler, or Bupropion SR (non-nicotine). Offer or recommend individual or group counseling to support you during the initial quitting and maintenance phase of your smoking cessation program. Offer or recommend other alternative treatments like hypnosis or acupuncture.  What you can expect: Benefits from quitting smoking:  Improved Physical Appearance - Minimizes or stops premature wrinkling of skin, bad breath, stained teeth, gum disease, clothes/hair "smoke" odors, and yellow fingernails  Improved Daily Activities - Food tastes better, sense of smell improves, and decreases shortness of breath during ordinary activities like climbing stairs, walking, and performing light housework  Decreased Financial Cost - From both no longer purchasing cigarettes and the health care cost for medical treatment of conditions caused by smoking.  Health of Others - Decreases risk of exposing others to the effects of second hand smoke.  Sets an example for youth. Benefits of Quitting according to research from the U.S. Surgeon General:  20 minutes after:  Blood pressure lowers & Body temperature normalizes  8 hours after: Carbon monoxide levels begins to normalize   24 hours after: Heart attack risk decreases  2 weeks to 3 months after:  Blood flow improves & lung function increases   1 to 9 months after:  Coughing, sinus congestion, fatigue, shortness of breath decrease   1 year after: Risk of developing coronary heart disease is half that of a smoker  5 years after: Risk of a stroke decreases to that of a nonsmoker  10 years after: Risk of death due to lung cancer death is halved.  Risk of oral, throat, esophagus, bladder, kidney, and pancreatic cancer decreases.  15 years after: Risk of developing  coronary heart disease is half that of a nonsmoker      References:  Here are references that can provide additional information and support:  American Cancer Society     American Heart Association (800) ACS-2345       (800) F1561943 or (800) 970 868 3208 www.cancer.org       www.amhrt.Water engineer Academy of Medical Acupuncture 573-053-0844 or 872-152-2827  (800(319) 482-1424 or 878-601-4449 www.lungusa.org       www.medicalacupuncture.org  National [employer] on Smoking & Health Cancer Information Service    Centers for Disease Control and Prevention (800) 4-CANCER or (800) Z5131811  424-534-4774 www.cancer.gov       UEarly.se  Nicotine Anonymous      Smokefree.gov 571-681-6945) TRY-NICA (219)564-7187)   919-530-8724) 44U-QUIT (479) 433-5628) www.nicotine-anonymous.org   www.smokefree.gov  Contact your doctor or pharmacist if you have specific questions about starting a smoking cessation program.    Also, use Allegra D and Nasalcrom for allergy symptoms.

## 2012-09-19 LAB — HM COLONOSCOPY: HM COLON: NORMAL

## 2013-07-20 LAB — HM MAMMOGRAPHY: HM MAMMO: NORMAL

## 2013-07-30 ENCOUNTER — Encounter (HOSPITAL_COMMUNITY): Payer: Self-pay | Admitting: Emergency Medicine

## 2013-07-30 ENCOUNTER — Emergency Department (HOSPITAL_COMMUNITY)
Admission: EM | Admit: 2013-07-30 | Discharge: 2013-07-30 | Disposition: A | Discharge status: Home / Self Care | Payer: BC Managed Care – PPO | Attending: Emergency Medicine | Admitting: Emergency Medicine

## 2013-07-30 ENCOUNTER — Emergency Department (HOSPITAL_COMMUNITY): Payer: BC Managed Care – PPO

## 2013-07-30 DIAGNOSIS — S8263XA Displaced fracture of lateral malleolus of unspecified fibula, initial encounter for closed fracture: Secondary | ICD-10-CM | POA: Insufficient documentation

## 2013-07-30 DIAGNOSIS — Y9289 Other specified places as the place of occurrence of the external cause: Secondary | ICD-10-CM | POA: Insufficient documentation

## 2013-07-30 DIAGNOSIS — X500XXA Overexertion from strenuous movement or load, initial encounter: Secondary | ICD-10-CM | POA: Insufficient documentation

## 2013-07-30 DIAGNOSIS — S8262XA Displaced fracture of lateral malleolus of left fibula, initial encounter for closed fracture: Secondary | ICD-10-CM

## 2013-07-30 DIAGNOSIS — Y9389 Activity, other specified: Secondary | ICD-10-CM | POA: Insufficient documentation

## 2013-07-30 DIAGNOSIS — W01119A Fall on same level from slipping, tripping and stumbling with subsequent striking against unspecified sharp object, initial encounter: Secondary | ICD-10-CM | POA: Insufficient documentation

## 2013-07-30 DIAGNOSIS — Z79899 Other long term (current) drug therapy: Secondary | ICD-10-CM | POA: Insufficient documentation

## 2013-07-30 DIAGNOSIS — Z8679 Personal history of other diseases of the circulatory system: Secondary | ICD-10-CM | POA: Insufficient documentation

## 2013-07-30 MED ORDER — OXYCODONE-ACETAMINOPHEN 5-325 MG PO TABS
2.0000 | ORAL_TABLET | Freq: Once | ORAL | Status: AC
  Administered 2013-07-30: 2 via ORAL
  Filled 2013-07-30: qty 2

## 2013-07-30 MED ORDER — OXYCODONE-ACETAMINOPHEN 5-325 MG PO TABS
1.0000 | ORAL_TABLET | ORAL | Status: DC | PRN
Start: 2013-07-30 — End: 2013-12-13

## 2013-07-30 NOTE — Progress Notes (Signed)
Orthopedic Tech Progress Note Patient Details:  Tracy [name] 11/23/1965 [id number]  Ortho Devices Type of Ortho Device: Stirrup splint;Post (short leg) splint;Crutches Ortho Device/Splint Interventions: Application   Cammer, Mickie Bail 07/30/2013, 5:29 PM

## 2013-07-30 NOTE — ED Notes (Signed)
Family brought to room.

## 2013-07-30 NOTE — ED Provider Notes (Signed)
CSN: [id number]     Arrival date & time 07/30/13  1517 History   First MD Initiated Contact with Patient 07/30/13 1603    This chart was scribed for Tracy Forth PA-C, a non-physician practitioner working with Tracy Skeens, MD by Tracy [name], ED Scribe. This patient was seen in room TR11C/TR11C and the patient's care was started at 5:11 PM     Chief Complaint  Patient presents with  . Foot Pain   (Consider location/radiation/quality/duration/timing/severity/associated sxs/prior Treatment) The history is provided by the patient. No language interpreter was used.   HPI Comments:  Tracy [name] is a 47 y.o. female who presents to the Emergency Department PMHx bilateral ACL repair complaining of constant severe left ankle pain radiating up to left lateral leg at about mid-shin onset acute PTA after mechanical fall on a sidewalk. States she inverted her left ankle during fall. Describes pain as 8/10 in severity. Reports associated constant mild paresthesias of toes. Reports pain is exacerbated by movement and ice. Denies any alleviating factors. Denies associated head injury, and other injuries.    Past Medical History  Diagnosis Date  . Mitral valve disorder    History reviewed. No pertinent past surgical history. History reviewed. No pertinent family history. History  Substance Use Topics  . Smoking status: Never Smoker   . Smokeless tobacco: Not on file  . Alcohol Use: Yes     Comment: occ   OB History   Grav Para Term Preterm Abortions TAB SAB Ect Mult Living                 Review of Systems  Constitutional: Negative for fever and chills.  Gastrointestinal: Negative for nausea and vomiting.  Musculoskeletal: Positive for arthralgias and joint swelling. Negative for back pain, neck pain and neck stiffness.  Skin: Negative for wound.  Neurological: Negative for numbness.  Hematological: Does not bruise/bleed easily.  Psychiatric/Behavioral: The patient is not  nervous/anxious.   All other systems reviewed and are negative.  A complete 10 system review of systems was obtained and all systems are negative except as noted in the HPI and PMHx.     Allergies  Review of patient's allergies indicates no known allergies.  Home Medications   Current Outpatient Rx  Name  Route  Sig  Dispense  Refill  . metoprolol (LOPRESSOR) 50 MG tablet   Oral   Take 50 mg by mouth at bedtime.          Marland Kitchen oxyCODONE-acetaminophen (PERCOCET/ROXICET) 5-325 MG per tablet   Oral   Take 1-2 tablets by mouth every 4 (four) hours as needed for severe pain.   15 tablet   0    BP 107/66  Pulse 81  Temp(Src) 97.6 F (36.4 C) (Oral)  Resp 24  SpO2 99% Physical Exam  Nursing note and vitals reviewed. Constitutional: She appears well-developed and well-nourished. No distress.  HENT:  Head: Normocephalic and atraumatic.  Eyes: Conjunctivae are normal.  Neck: Normal range of motion.  Cardiovascular: Normal rate, regular rhythm, normal heart sounds and intact distal pulses.   No murmur heard. Pulses:      Dorsalis pedis pulses are 2+ on the left side.       Posterior tibial pulses are 2+ on the left side.  Capillary refill less than 3 seconds  Pulmonary/Chest: Effort normal and breath sounds normal. No respiratory distress.  Musculoskeletal: She exhibits tenderness. She exhibits no edema.       Left ankle: She exhibits  decreased range of motion. Tenderness. Lateral malleolus tenderness found. Achilles tendon normal.  Left ankle:  Tenderness and mild swelling over lateral malleolus  Decreased ROM of left ankle secondary to pain  Cap refill <3 seconds Left knee and hip of affected ankle joint has normal ROM   Neurological: She is alert. No sensory deficit. Coordination normal.  Left AJ 3/5 strength decreased secondary to pain  Sensation intact to dull and sharp  Skin: Skin is warm and dry. She is not diaphoretic.  No tenting of the skin  Psychiatric: She has  a normal mood and affect.    ED Course  Procedures (including critical care time) COORDINATION OF CARE:  Nursing notes reviewed. Vital signs reviewed. Initial pt interview and examination performed.   5:11 PM-Discussed work up plan with pt at bedside, which includes left ankle x-ray. Pt agrees with plan. 5:11 PM Nursing Notes Reviewed/ Care Coordinated Applicable Imaging Reviewed and incorporated into ED treatment Discussed results and treatment plan with pt. Pt demonstrates understanding and agrees with plan to f/u with orthopedist.   Treatment plan initiated: Medications  oxyCODONE-acetaminophen (PERCOCET/ROXICET) 5-325 MG per tablet 2 tablet (2 tablets Oral Given 07/30/13 1644)     Initial diagnostic testing ordered.    Labs Review Labs Reviewed - No data to display Imaging Review Dg Ankle Complete Left  07/30/2013   CLINICAL DATA:  Lateral ankle pain after fall.  EXAM: LEFT ANKLE COMPLETE - 3+ VIEW  COMPARISON:  None.  FINDINGS: There is an acute, nondisplaced and transversely oriented fracture through the lateral malleolus, with adjacent soft tissue swelling. Ankle joint is located. The distal tibia is intact. No additional fracture is seen. There are postsurgical changes of the 1st metatarsal.  IMPRESSION: Acute nondisplaced transverse fracture of the lateral malleolus with adjacent soft tissue swelling.   Electronically Signed   By: Britta Mccreedy M.D.   On: 07/30/2013 16:05    EKG Interpretation   None       MDM   1. Closed low lateral malleolus fracture, left, initial encounter     [name] presents with ankle pain after fall.  Patient X-Ray with acute, nondisplaced transverse fracture of the lateral malleolus.  I personally reviewed the imaging tests through PACS system.  I reviewed available ER/hospitalization records through the EMR.   Pain managed in ED. Pt advised to follow up with orthopedics for further evaluation and treatment.  Pain managed in the  department. Patient given  Short-leg splint while in ED, conservative therapy recommended and discussed. Patient will be dc home & is agreeable with above plan. I have also discussed reasons to return immediately to the ER.  Patient expresses understanding and agrees with plan.  It has been determined that no acute conditions requiring further emergency intervention are present at this time. The patient/guardian have been advised of the diagnosis and plan. We have discussed signs and symptoms that warrant return to the ED, such as changes or worsening in symptoms.   Vital signs are stable at discharge.   BP 107/66  Pulse 81  Temp(Src) 97.6 F (36.4 C) (Oral)  Resp 24  SpO2 99%  Patient/guardian has voiced understanding and agreed to follow-up with the PCP or specialist.    I personally performed the services described in this documentation, which was scribed in my presence. The recorded information has been reviewed and is accurate.    Dahlia Client Joshoa Shawler, PA-C 07/30/13 1711

## 2013-07-30 NOTE — ED Notes (Signed)
Pt c/o left ankle pain after falling and twisting ankle; swelling noted and CMS intact

## 2013-07-31 NOTE — ED Provider Notes (Signed)
Medical screening examination/treatment/procedure(s) were performed by non-physician practitioner and as supervising physician I was immediately available for consultation/collaboration.  EKG Interpretation   None         Tracy [name] M Hermina Barnard, MD 07/31/13 0104 

## 2013-11-04 ENCOUNTER — Ambulatory Visit: Payer: BC Managed Care – PPO | Admitting: Family

## 2013-11-11 ENCOUNTER — Ambulatory Visit: Payer: BC Managed Care – PPO | Admitting: Family

## 2013-12-13 ENCOUNTER — Encounter: Payer: Self-pay | Admitting: Family

## 2013-12-13 ENCOUNTER — Ambulatory Visit (INDEPENDENT_AMBULATORY_CARE_PROVIDER_SITE_OTHER): Payer: BC Managed Care – PPO | Admitting: Family

## 2013-12-13 VITALS — BP 110/70 | HR 58 | Temp 98.0°F | Resp 16 | Ht 67.0 in | Wt 153.1 lb

## 2013-12-13 DIAGNOSIS — IMO0002 Reserved for concepts with insufficient information to code with codable children: Secondary | ICD-10-CM

## 2013-12-13 DIAGNOSIS — Z8719 Personal history of other diseases of the digestive system: Secondary | ICD-10-CM

## 2013-12-13 DIAGNOSIS — M545 Low back pain, unspecified: Secondary | ICD-10-CM

## 2013-12-13 DIAGNOSIS — Q600 Renal agenesis, unilateral: Secondary | ICD-10-CM

## 2013-12-13 DIAGNOSIS — F329 Major depressive disorder, single episode, unspecified: Secondary | ICD-10-CM

## 2013-12-13 DIAGNOSIS — G8929 Other chronic pain: Secondary | ICD-10-CM

## 2013-12-13 DIAGNOSIS — I341 Nonrheumatic mitral (valve) prolapse: Secondary | ICD-10-CM | POA: Insufficient documentation

## 2013-12-13 DIAGNOSIS — Q605 Renal hypoplasia, unspecified: Secondary | ICD-10-CM

## 2013-12-13 DIAGNOSIS — F3289 Other specified depressive episodes: Secondary | ICD-10-CM

## 2013-12-13 DIAGNOSIS — I059 Rheumatic mitral valve disease, unspecified: Secondary | ICD-10-CM

## 2013-12-13 DIAGNOSIS — K279 Peptic ulcer, site unspecified, unspecified as acute or chronic, without hemorrhage or perforation: Secondary | ICD-10-CM

## 2013-12-13 DIAGNOSIS — Z872 Personal history of diseases of the skin and subcutaneous tissue: Secondary | ICD-10-CM

## 2013-12-13 DIAGNOSIS — Z8711 Personal history of peptic ulcer disease: Secondary | ICD-10-CM

## 2013-12-13 DIAGNOSIS — Q602 Renal agenesis, unspecified: Secondary | ICD-10-CM

## 2013-12-13 DIAGNOSIS — F32A Depression, unspecified: Secondary | ICD-10-CM | POA: Insufficient documentation

## 2013-12-13 LAB — CBC WITH DIFFERENTIAL/PLATELET
Basophils Absolute: 0 10*3/uL (ref 0.0–0.1)
Basophils Relative: 0 % (ref 0–1)
Eosinophils Absolute: 0.1 10*3/uL (ref 0.0–0.7)
Eosinophils Relative: 2 % (ref 0–5)
HCT: 41.2 % (ref 36.0–46.0)
HEMOGLOBIN: 13.9 g/dL (ref 12.0–15.0)
LYMPHS ABS: 2.3 10*3/uL (ref 0.7–4.0)
LYMPHS PCT: 45 % (ref 12–46)
MCH: 30.4 pg (ref 26.0–34.0)
MCHC: 33.7 g/dL (ref 30.0–36.0)
MCV: 90.2 fL (ref 78.0–100.0)
MONOS PCT: 11 % (ref 3–12)
Monocytes Absolute: 0.6 10*3/uL (ref 0.1–1.0)
NEUTROS ABS: 2.1 10*3/uL (ref 1.7–7.7)
NEUTROS PCT: 42 % — AB (ref 43–77)
PLATELETS: 266 10*3/uL (ref 150–400)
RBC: 4.57 MIL/uL (ref 3.87–5.11)
RDW: 13.2 % (ref 11.5–15.5)
WBC: 5 10*3/uL (ref 4.0–10.5)

## 2013-12-13 LAB — BASIC METABOLIC PANEL
BUN: 10 mg/dL (ref 6–23)
CHLORIDE: 104 meq/L (ref 96–112)
CO2: 27 mEq/L (ref 19–32)
CREATININE: 0.94 mg/dL (ref 0.50–1.10)
Calcium: 9.8 mg/dL (ref 8.4–10.5)
Glucose, Bld: 80 mg/dL (ref 70–99)
Potassium: 4.4 mEq/L (ref 3.5–5.3)
Sodium: 139 mEq/L (ref 135–145)

## 2013-12-13 NOTE — Assessment & Plan Note (Signed)
Follows with [facility]. Glennon HamiltonHoller at [facility].

## 2013-12-13 NOTE — Patient Instructions (Addendum)
Stop goody powder and stop advil. You will be contacted about your referral to the therapist.  Please let us know if you have not heard back within 1 week about your referral. Please complete lab work prior to leaving.  Please schedule fasting physical at the front desk.

## 2013-12-13 NOTE — Assessment & Plan Note (Signed)
Follows with pain management Dr. Silver HugueninSarah Ferraby. Maintained on norco- defer med management to pain management.

## 2013-12-13 NOTE — Assessment & Plan Note (Signed)
Clinically stable. Follows with Dr. Garner Nashaniels at [facility].
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
## 2013-12-13 NOTE — Assessment & Plan Note (Addendum)
Reports nightly use of Advil PM and Goody PM  Due to insomnia. Advised pt to d/c motrin and aspirin and instead take 1 tablet of benadryl as needed for sleep.  Will check CBC to assess for anemia. She is followed by GI.  Consider addition of PPI next visit for GI protection.

## 2013-12-13 NOTE — Assessment & Plan Note (Signed)
We discussed importance of avoiding NSAIDS due to risk of kidney injury.

## 2013-12-13 NOTE — Progress Notes (Signed)
Pre visit review using our clinic review tool, if applicable. No additional management support is needed unless otherwise documented below in the visit note. 

## 2013-12-13 NOTE — Assessment & Plan Note (Signed)
We discussed addition of SSRI, she declines.  She is agreeable to meet with a therapist which I think would really help her. Will place referral.

## 2013-12-30 ENCOUNTER — Ambulatory Visit (INDEPENDENT_AMBULATORY_CARE_PROVIDER_SITE_OTHER): Payer: BC Managed Care – PPO | Admitting: Family

## 2013-12-30 ENCOUNTER — Encounter: Payer: Self-pay | Admitting: Family

## 2013-12-30 VITALS — BP 98/64 | HR 67 | Temp 97.8°F | Resp 16 | Ht 67.0 in | Wt 152.0 lb

## 2013-12-30 DIAGNOSIS — Z0000 Encounter for general adult medical examination without abnormal findings: Secondary | ICD-10-CM | POA: Insufficient documentation

## 2013-12-30 DIAGNOSIS — F3289 Other specified depressive episodes: Secondary | ICD-10-CM

## 2013-12-30 DIAGNOSIS — Z23 Encounter for immunization: Secondary | ICD-10-CM

## 2013-12-30 DIAGNOSIS — F329 Major depressive disorder, single episode, unspecified: Secondary | ICD-10-CM

## 2013-12-30 DIAGNOSIS — F32A Depression, unspecified: Secondary | ICD-10-CM

## 2013-12-30 LAB — LIPID PANEL
CHOL/HDL RATIO: 3 ratio
CHOLESTEROL: 215 mg/dL — AB (ref 0–200)
HDL: 72 mg/dL (ref >39)
LDL CALC: 126 mg/dL — AB (ref 0–99)
TRIGLYCERIDES: 86 mg/dL (ref <150)
VLDL: 17 mg/dL (ref 0–40)

## 2013-12-30 LAB — TSH: TSH: 2.5 u[IU]/mL (ref 0.350–4.500)

## 2013-12-30 LAB — HEPATIC FUNCTION PANEL
ALT: 11 U/L (ref 0–35)
AST: 14 U/L (ref 0–37)
Albumin: 4.4 g/dL (ref 3.5–5.2)
Alkaline Phosphatase: 78 U/L (ref 39–117)
BILIRUBIN TOTAL: 0.3 mg/dL (ref 0.2–1.2)
Total Protein: 6.6 g/dL (ref 6.0–8.3)

## 2013-12-30 LAB — HIV ANTIBODY (ROUTINE TESTING W REFLEX): HIV: NONREACTIVE

## 2013-12-30 NOTE — Assessment & Plan Note (Signed)
Continue healthy diet, exercise. Obtain fasting labs.  Tdap today.

## 2013-12-30 NOTE — Progress Notes (Signed)
Pre visit review using our clinic review tool, if applicable. No additional management support is needed unless otherwise documented below in the visit note. 
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
## 2013-12-30 NOTE — Patient Instructions (Signed)
Please complete lab work prior to leaving. Follow up in 4 months- sooner if problems/concerns.

## 2013-12-30 NOTE — Addendum Note (Signed)
Addended by: Mervin KungFERGERSON, Zannah Melucci A on: 12/30/2013 08:51 AM   Modules accepted: Orders

## 2013-12-31 LAB — URINALYSIS, ROUTINE W REFLEX MICROSCOPIC

## 2014-01-01 ENCOUNTER — Encounter: Payer: Self-pay | Admitting: Family

## 2014-01-02 ENCOUNTER — Other Ambulatory Visit: Payer: Self-pay | Admitting: Family

## 2014-01-03 LAB — URINALYSIS, MICROSCOPIC ONLY
Bacteria, UA: NONE SEEN
CASTS: NONE SEEN
CRYSTALS: NONE SEEN

## 2014-01-03 LAB — URINALYSIS, ROUTINE W REFLEX MICROSCOPIC
Bilirubin Urine: NEGATIVE
GLUCOSE, UA: NEGATIVE mg/dL
Hgb urine dipstick: NEGATIVE
KETONES UR: NEGATIVE mg/dL
NITRITE: NEGATIVE
PH: 6.5 (ref 5.0–8.0)
Protein, ur: NEGATIVE mg/dL
SPECIFIC GRAVITY, URINE: 1.011 (ref 1.005–1.030)
Urobilinogen, UA: 0.2 mg/dL (ref 0.0–1.0)

## 2014-01-07 ENCOUNTER — Encounter: Payer: Self-pay | Admitting: Family

## 2014-01-17 ENCOUNTER — Ambulatory Visit: Payer: BC Managed Care – PPO | Admitting: Psychology

## 2014-02-13 ENCOUNTER — Ambulatory Visit: Payer: BC Managed Care – PPO | Admitting: Physician Assistant

## 2014-02-18 ENCOUNTER — Other Ambulatory Visit: Payer: Self-pay | Admitting: Urology

## 2014-03-26 ENCOUNTER — Encounter (HOSPITAL_BASED_OUTPATIENT_CLINIC_OR_DEPARTMENT_OTHER): Payer: Self-pay | Admitting: *Deleted

## 2014-03-27 ENCOUNTER — Encounter (HOSPITAL_BASED_OUTPATIENT_CLINIC_OR_DEPARTMENT_OTHER): Payer: Self-pay | Admitting: *Deleted

## 2014-03-27 NOTE — Progress Notes (Signed)
NPO AFTER MN. ARRIVE AT 0830. NEEDS HG AND EKG. MAY TAKE HYDROCODONE IF NEEDED W/ SIPS OF WATER AM DOS.

## 2014-03-31 NOTE — H&P (Signed)
History of Present Illness   [name] had an eroded TVT sling treated by Dr Etta GrandchildSural in 2004. She had a hysterectomy and rectocele repair in 2008 with Dr Vincente PoliGrewal. Simultaneously I performed an anterior repair and sling and she was continent afterwards. She has had a nephrectomy due to trauma. When I saw her last time she had equally significant stress incontinence, urge incontinence, and leakage not associated with awareness. She is wearing 5 pads a day moderately wet. She voids every 30 minutes and gets up 4-5 times a night. She does do some positional changes to urinate. She will lean forward and strain and push. She says then her flow is good.  Approximately 1 year ago she saw [facility]le[facility] and one of his partners. She said the pain was primarily in the suprapubic area. She says her husband left her because of dyspareunia. I think the sling was removed and she had Botox at the same time. She reports that her straining with urination started after the Botox and is still persisting now. She says the discomfort is a little bit better but she still has dyspareunia. She says the incontinence is unchanged since the sling was removed. It sounded like they may have recommended a pubovaginal sling or fascial sling. She has had a left nephrectomy.  On pelvic examination she had grade 2 hypermobility. At rest she had excellent support of the bladder neck and no cystocele or rectocele. Her tissues were a little bit tender anteriorly and left levator muscles were tender. The right levator muscles seemed tight. She did not have stress incontinence after cystoscopy with a good cough. Residual was low. Cystoscopy was normal.   In distant past records, a physician had noted she may have interstitial cystitis and dyspareunia. I was suspect of this diagnosis when I interviewed her last time.   I reviewed [facility] of [facility]. In August 2014, she had 100 units of Botox and local  excision of both slings. She did not have erosion or extrusion. On preoperative evaluation, she was quite upset. She said her main symptom was frequency and urge incontinence and nocturia. She is voiding every hour and gets 4-5 times at night. She did not have tenderness of urethra but had tenderness of her levator muscles preoperatively.   She did not void and was catheterized for 175 mL on urodynamics. Maximum capacity was 935 mL. Bladder had low-pressure instability reaching pressures of 7 cmH2O, but she did not leak. She had very mild leakage at 200 mL at 136 cmH2O. At 800 mL, she had moderate leakage at 76 cmH2O. During voluntary voiding she voided 670 mL with a maximum flow of 13 mL/sec. Maximum voiding pressure was 14 cmH2O. Residual was 265 mL. She was coached not to strain. EMG activity increased during the voiding phase. Bladder neck descended approximately 2 cm. Her bladder was somewhat hyposensitive. She did not leak with every cough and Valsalva. The details of the urodynamics are signed and dictated on the urodynamic sheet.    Past Medical History Problems  1. History of Coronary Artery Disease (V12.59) 2. History of Murmur (785.2)  Surgical History Problems  1. History of Bladder Surgery 2. History of Nephrectomy Left 3. History of Tubal Ligation 4. History of Vaginal Sling Operation For Stress Incontinence  Current Meds 1. Hydrocodone-Acetaminophen TABS;  Therapy: (Recorded:22Apr2015) to Recorded 2. Lopressor TABS;  Therapy: (Recorded:03Jan2008) to Recorded  Allergies Medication  1. No Known Drug Allergies  Family History Problems  1. Family history of Cancer   grandmother - colon cancer 2. Family history of Cardiac Failure 3. Family history of Family Health Status Number Of Children   2 daughters 4. Family history of Urologic Disorder (V18.7)   kidney stones  Social History Problems  1. Alcohol Use   1 per week 2. Caffeine Use   2 per week 3. Family  history of Death In The Family Father 4. Family history of Death In The Family Mother 5. Occupation:   Brewing technologist 6. Separated from significant other (V61.03)   for 6 years as of 01/08/14 office visit. 7. History of Tobacco Use (V15.82)   quit 05/2013.  Assessment Assessed  1. Urinary incontinence without sensory awareness (788.34) 2. 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 Follow-up Schedule Surgery Office  Follow-up  Status: Hold For - Appointment   Requested for: 28May2015  Discussion/Summary   [name] has complicated voiding dysfunction. She has a minimal outlet abnormality on urodynamics. Her leakage without awareness in my opinion is likely not due to her mild outlet abnormality and is likely due to bladder overactivity as is her urge incontinence. She has frequency every 30 minutes and significant nocturia as well. I think she has been a chronic pain patient for a long time and has ongoing pelvic pain and dyspareunia.   My index of suspicion is moderately high that she has interstitial cystitis. I discussed a hydrodistension with her.  We talked about cystoscopy/hydrodistension and instillation in detail. Pros, cons, general surgical and anesthetic risks, and other options including watchful waiting were discussed. Risks were described but not limited to pain, infection, and bleeding. The risk of bladder perforation and management were discussed. The patient understands that it is primarily a diagnostic procedure.   Her urodynamics did not demonstrate findings in keeping with interstitial cystitis, though she did have some dyssynergia. Medical and behavioral therapy and physical therapy will be treatment options moving forward for most of her symptoms. I would not recommend another outlet procedure at this time, especially with her pain syndrome and her voiding dysfunction. I would be concerned about persistent  overactive bladder or worsening symptoms postoperatively. Neuromodulation treatment such as PTNS and Interstim may also be options. I believe the physicians at [facility] also felt that her main problem was an overactive bladder, recommending Botox.   [name] understands that 70-80% of her problem is an overactive bladder and it may be inflamed. If she ever needed an outlet procedure, I think Macroplastique would be a very good thing to consider. This also would fit with her large capacity poorly contractile bladder. It would likely not make her pain syndrome worse, though this would have to be warned of. We are going to schedule a hydrodistension and proceed accordingly.   After a thorough review of the management options for the patient's condition the patient  elected to proceed with surgical therapy as noted above. We have discussed the potential benefits and risks of the procedure, side effects of the proposed treatment, the likelihood of the patient achieving the goals of the procedure, and any potential problems that might occur during the procedure or recuperation. Informed consent has been obtained.

## 2014-04-01 ENCOUNTER — Encounter (HOSPITAL_BASED_OUTPATIENT_CLINIC_OR_DEPARTMENT_OTHER): Payer: Self-pay

## 2014-04-01 ENCOUNTER — Ambulatory Visit (HOSPITAL_BASED_OUTPATIENT_CLINIC_OR_DEPARTMENT_OTHER): Payer: BC Managed Care – PPO | Admitting: Anesthesiology

## 2014-04-01 ENCOUNTER — Encounter (HOSPITAL_BASED_OUTPATIENT_CLINIC_OR_DEPARTMENT_OTHER)
Admission: RE | Disposition: A | Discharge status: Home / Self Care | Payer: Self-pay | Source: Ambulatory Visit | Attending: Urology

## 2014-04-01 ENCOUNTER — Ambulatory Visit (HOSPITAL_BASED_OUTPATIENT_CLINIC_OR_DEPARTMENT_OTHER)
Admission: RE | Admit: 2014-04-01 | Discharge: 2014-04-01 | Disposition: A | Discharge status: Home / Self Care | Payer: BC Managed Care – PPO | Source: Ambulatory Visit | Attending: Urology | Admitting: Urology

## 2014-04-01 ENCOUNTER — Encounter (HOSPITAL_BASED_OUTPATIENT_CLINIC_OR_DEPARTMENT_OTHER): Payer: BC Managed Care – PPO | Admitting: Anesthesiology

## 2014-04-01 DIAGNOSIS — N318 Other neuromuscular dysfunction of bladder: Secondary | ICD-10-CM | POA: Insufficient documentation

## 2014-04-01 DIAGNOSIS — N949 Unspecified condition associated with female genital organs and menstrual cycle: Secondary | ICD-10-CM | POA: Insufficient documentation

## 2014-04-01 DIAGNOSIS — Z905 Acquired absence of kidney: Secondary | ICD-10-CM | POA: Insufficient documentation

## 2014-04-01 DIAGNOSIS — N3644 Muscular disorders of urethra: Secondary | ICD-10-CM | POA: Insufficient documentation

## 2014-04-01 DIAGNOSIS — F329 Major depressive disorder, single episode, unspecified: Secondary | ICD-10-CM | POA: Insufficient documentation

## 2014-04-01 DIAGNOSIS — I2510 Atherosclerotic heart disease of native coronary artery without angina pectoris: Secondary | ICD-10-CM | POA: Insufficient documentation

## 2014-04-01 DIAGNOSIS — F3289 Other specified depressive episodes: Secondary | ICD-10-CM | POA: Insufficient documentation

## 2014-04-01 DIAGNOSIS — N3942 Incontinence without sensory awareness: Secondary | ICD-10-CM | POA: Insufficient documentation

## 2014-04-01 DIAGNOSIS — Z79899 Other long term (current) drug therapy: Secondary | ICD-10-CM | POA: Insufficient documentation

## 2014-04-01 DIAGNOSIS — Z87891 Personal history of nicotine dependence: Secondary | ICD-10-CM | POA: Insufficient documentation

## 2014-04-01 DIAGNOSIS — R350 Frequency of micturition: Secondary | ICD-10-CM | POA: Insufficient documentation

## 2014-04-01 DIAGNOSIS — N3946 Mixed incontinence: Secondary | ICD-10-CM | POA: Insufficient documentation

## 2014-04-01 HISTORY — DX: Personal history of other diseases of the digestive system: Z87.19

## 2014-04-01 HISTORY — DX: Personal history of other diseases of the circulatory system: Z86.79

## 2014-04-01 HISTORY — DX: Pelvic and perineal pain: R10.2

## 2014-04-01 HISTORY — DX: Personal history of peptic ulcer disease: Z87.11

## 2014-04-01 HISTORY — DX: Other constipation: K59.09

## 2014-04-01 HISTORY — DX: Hyperlipidemia, unspecified: E78.5

## 2014-04-01 HISTORY — DX: Frequency of micturition: R35.0

## 2014-04-01 HISTORY — DX: Spinal stenosis, lumbar region without neurogenic claudication: M48.061

## 2014-04-01 HISTORY — DX: Ventricular premature depolarization: I49.3

## 2014-04-01 HISTORY — PX: CYSTO WITH HYDRODISTENSION: SHX5453

## 2014-04-01 HISTORY — DX: Personal history of diseases of the skin and subcutaneous tissue: Z87.2

## 2014-04-01 LAB — POCT HEMOGLOBIN-HEMACUE: Hemoglobin: 14.5 g/dL (ref 12.0–15.0)

## 2014-04-01 SURGERY — CYSTOSCOPY, WITH BLADDER HYDRODISTENSION
Anesthesia: General | Site: Bladder

## 2014-04-01 MED ORDER — CIPROFLOXACIN IN D5W 400 MG/200ML IV SOLN
400.0000 mg | INTRAVENOUS | Status: AC
  Administered 2014-04-01: 400 mg via INTRAVENOUS
  Filled 2014-04-01: qty 200

## 2014-04-01 MED ORDER — PROMETHAZINE HCL 25 MG/ML IJ SOLN
INTRAMUSCULAR | Status: AC
  Filled 2014-04-01: qty 1

## 2014-04-01 MED ORDER — ONDANSETRON HCL 4 MG/2ML IJ SOLN
INTRAMUSCULAR | Status: DC | PRN
  Administered 2014-04-01: 4 mg via INTRAVENOUS

## 2014-04-01 MED ORDER — DEXAMETHASONE SODIUM PHOSPHATE 4 MG/ML IJ SOLN
INTRAMUSCULAR | Status: DC | PRN
  Administered 2014-04-01: 10 mg via INTRAVENOUS

## 2014-04-01 MED ORDER — PROMETHAZINE HCL 25 MG/ML IJ SOLN
6.2500 mg | INTRAMUSCULAR | Status: DC | PRN
  Administered 2014-04-01: 6.25 mg via INTRAVENOUS
  Filled 2014-04-01: qty 1

## 2014-04-01 MED ORDER — LIDOCAINE HCL (CARDIAC) 20 MG/ML IV SOLN
INTRAVENOUS | Status: DC | PRN
  Administered 2014-04-01: 80 mg via INTRAVENOUS

## 2014-04-01 MED ORDER — KETOROLAC TROMETHAMINE 30 MG/ML IJ SOLN
INTRAMUSCULAR | Status: DC | PRN
  Administered 2014-04-01: 30 mg via INTRAVENOUS

## 2014-04-01 MED ORDER — MIDAZOLAM HCL 2 MG/2ML IJ SOLN
INTRAMUSCULAR | Status: AC
  Filled 2014-04-01: qty 2

## 2014-04-01 MED ORDER — MIDAZOLAM HCL 5 MG/5ML IJ SOLN
INTRAMUSCULAR | Status: DC | PRN
  Administered 2014-04-01 (×2): 1 mg via INTRAVENOUS

## 2014-04-01 MED ORDER — HYDROMORPHONE HCL PF 1 MG/ML IJ SOLN
0.2500 mg | INTRAMUSCULAR | Status: DC | PRN
  Filled 2014-04-01: qty 1

## 2014-04-01 MED ORDER — CIPROFLOXACIN HCL 250 MG PO TABS: 250.0000 mg | ORAL_TABLET | Freq: Two times a day (BID) | ORAL | Status: DC

## 2014-04-01 MED ORDER — STERILE WATER FOR IRRIGATION IR SOLN
Status: DC | PRN
  Administered 2014-04-01: 3000 mL via INTRAVESICAL

## 2014-04-01 MED ORDER — HYDROCODONE-ACETAMINOPHEN 5-325 MG PO TABS: 1.0000 | ORAL_TABLET | Freq: Four times a day (QID) | ORAL | Status: DC | PRN

## 2014-04-01 MED ORDER — LACTATED RINGERS IV SOLN
INTRAVENOUS | Status: DC | PRN
  Administered 2014-04-01: 09:00:00 via INTRAVENOUS

## 2014-04-01 MED ORDER — OXYCODONE HCL 5 MG PO TABS
5.0000 mg | ORAL_TABLET | Freq: Once | ORAL | Status: DC | PRN
  Filled 2014-04-01: qty 1

## 2014-04-01 MED ORDER — OXYCODONE HCL 5 MG/5ML PO SOLN
5.0000 mg | Freq: Once | ORAL | Status: DC | PRN
  Filled 2014-04-01: qty 5

## 2014-04-01 MED ORDER — ACETAMINOPHEN 10 MG/ML IV SOLN
INTRAVENOUS | Status: DC | PRN
  Administered 2014-04-01: 1000 mg via INTRAVENOUS

## 2014-04-01 MED ORDER — PHENAZOPYRIDINE HCL 200 MG PO TABS
ORAL_TABLET | ORAL | Status: DC | PRN
  Administered 2014-04-01: 11:00:00 via INTRAVESICAL

## 2014-04-01 MED ORDER — MEPERIDINE HCL 25 MG/ML IJ SOLN
6.2500 mg | INTRAMUSCULAR | Status: DC | PRN
  Filled 2014-04-01: qty 1

## 2014-04-01 MED ORDER — FENTANYL CITRATE 0.05 MG/ML IJ SOLN
INTRAMUSCULAR | Status: AC
  Filled 2014-04-01: qty 4

## 2014-04-01 MED ORDER — LACTATED RINGERS IV SOLN
INTRAVENOUS | Status: DC
  Administered 2014-04-01: 09:00:00 via INTRAVENOUS
  Filled 2014-04-01: qty 1000

## 2014-04-01 MED ORDER — FENTANYL CITRATE 0.05 MG/ML IJ SOLN
INTRAMUSCULAR | Status: DC | PRN
  Administered 2014-04-01: 25 ug via INTRAVENOUS
  Administered 2014-04-01 (×2): 12.5 ug via INTRAVENOUS
  Administered 2014-04-01 (×2): 25 ug via INTRAVENOUS
  Administered 2014-04-01: 12.5 ug via INTRAVENOUS
  Administered 2014-04-01 (×2): 25 ug via INTRAVENOUS
  Administered 2014-04-01: 12.5 ug via INTRAVENOUS
  Administered 2014-04-01: 25 ug via INTRAVENOUS

## 2014-04-01 MED ORDER — PROPOFOL 10 MG/ML IV BOLUS
INTRAVENOUS | Status: DC | PRN
  Administered 2014-04-01: 200 mg via INTRAVENOUS

## 2014-04-01 SURGICAL SUPPLY — 26 items
BAG DRAIN URO-CYSTO SKYTR STRL (DRAIN) ×3 IMPLANT
BAG DRN UROCATH (DRAIN) ×1
CANISTER SUCT LVC 12 LTR MEDI- (MISCELLANEOUS) IMPLANT
CATH FOLEY 2WAY SLVR  5CC 18FR (CATHETERS)
CATH FOLEY 2WAY SLVR 5CC 18FR (CATHETERS) IMPLANT
CATH ROBINSON RED A/P 12FR (CATHETERS) IMPLANT
CATH ROBINSON RED A/P 14FR (CATHETERS) IMPLANT
CLOTH BEACON ORANGE TIMEOUT ST (SAFETY) ×3 IMPLANT
DRAPE CAMERA CLOSED 9X96 (DRAPES) ×3 IMPLANT
ELECT REM PT RETURN 9FT ADLT (ELECTROSURGICAL)
ELECTRODE REM PT RTRN 9FT ADLT (ELECTROSURGICAL) IMPLANT
GLOVE BIO SURGEON STRL SZ 6.5 (GLOVE) ×1 IMPLANT
GLOVE BIO SURGEON STRL SZ7.5 (GLOVE) ×3 IMPLANT
GLOVE BIO SURGEONS STRL SZ 6.5 (GLOVE) ×1
GLOVE INDICATOR 7.0 STRL GRN (GLOVE) ×2 IMPLANT
GOWN STRL REIN XL XLG (GOWN DISPOSABLE) ×1 IMPLANT
GOWN STRL REUS W/ TWL LRG LVL3 (GOWN DISPOSABLE) IMPLANT
GOWN STRL REUS W/ TWL XL LVL3 (GOWN DISPOSABLE) IMPLANT
GOWN STRL REUS W/TWL LRG LVL3 (GOWN DISPOSABLE) ×3
GOWN STRL REUS W/TWL XL LVL3 (GOWN DISPOSABLE) ×3
NDL SAFETY ECLIPSE 18X1.5 (NEEDLE) ×1 IMPLANT
NEEDLE HYPO 18GX1.5 SHARP (NEEDLE) ×3
PACK CYSTOSCOPY (CUSTOM PROCEDURE TRAY) ×3 IMPLANT
SUT SILK 0 TIES 10X30 (SUTURE) IMPLANT
SYR 20CC LL (SYRINGE) ×3 IMPLANT
WATER STERILE IRR 3000ML UROMA (IV SOLUTION) ×3 IMPLANT

## 2014-04-01 NOTE — Anesthesia Postprocedure Evaluation (Signed)
Anesthesia Post Note  Patient: Tracy ChildesLinda G [name]  Procedure(s) Performed: Procedure(s) (LRB): CYSTOSCOPY/HYDRODISTENSION WITH INSTILLATION (N/A)  Anesthesia type: General  Patient location: PACU  Post pain: Pain level controlled  Post assessment: Post-op Vital signs reviewed  Last Vitals: BP 117/82  Pulse 64  Temp(Src) 36.2 C (Oral)  Resp 16  Ht 5\' 7"  (1.702 m)  Wt 148 lb (67.132 kg)  BMI 23.17 kg/m2  SpO2 97%  Post vital signs: Reviewed  Level of consciousness: sedated  Complications: No apparent anesthesia complications

## 2014-04-01 NOTE — Discharge Instructions (Signed)
I have reviewed discharge instructions in detail with the patient. They will follow-up with me or their physician as scheduled. My nurse will also be calling the patients as per protocol. ° ° ° CYSTOSCOPY HOME CARE INSTRUCTIONS ° °Activity: °Rest for the remainder of the day.  Do not drive or operate equipment today.  You may resume normal activities in one to two days as instructed by your physician.  ° °Meals: °Drink plenty of liquids and eat light foods such as gelatin or soup this evening.  You may return to a normal meal plan tomorrow. ° °Return to Work: °You may return to work in one to two days or as instructed by your physician. ° °Special Instructions / Symptoms: °Call your physician if any of these symptoms occur: ° ° -persistent or heavy bleeding ° -bleeding which continues after first few urination ° -large blood clots that are difficult to pass ° -urine stream diminishes or stops completely ° -fever equal to or higher than 101 degrees Farenheit. ° -cloudy urine with a strong, foul odor ° -severe pain ° °Females should always wipe from front to back after elimination.  You may feel some burning pain when you urinate.  This should disappear with time.  Applying moist heat to the lower abdomen or a hot tub bath may help relieve the pain. \ ° °Follow-Up / Date of Return Visit to Your Physician:  °Call for an appointment to arrange follow-up. ° °Patient Signature:  ________________________________________________________ ° °Nurse's Signature:  ________________________________________________________ ° °Post Anesthesia Home Care Instructions ° °Activity: °Get plenty of rest for the remainder of the day. A responsible adult should stay with you for 24 hours following the procedure.  °For the next 24 hours, DO NOT: °-Drive a car °-Operate machinery °-Drink alcoholic beverages °-Take any medication unless instructed by your physician °-Make any legal decisions or sign important papers. ° °Meals: °Start with  liquid foods such as gelatin or soup. Progress to regular foods as tolerated. Avoid greasy, spicy, heavy foods. If nausea and/or vomiting occur, drink only clear liquids until the nausea and/or vomiting subsides. Call your physician if vomiting continues. ° °Special Instructions/Symptoms: °Your throat may feel dry or sore from the anesthesia or the breathing tube placed in your throat during surgery. If this causes discomfort, gargle with warm salt water. The discomfort should disappear within 24 hours. ° °

## 2014-04-01 NOTE — Progress Notes (Signed)
Dr. Sherron MondayMacdiarmid in to see patient ok to d discharge patient home

## 2014-04-01 NOTE — Anesthesia Procedure Notes (Signed)
Procedure Name: LMA Insertion Date/Time: 04/01/2014 10:25 AM Performed by: Jessica PriestBEESON, Tracy Bellis C Pre-anesthesia Checklist: Patient identified, Emergency Drugs available, Suction available and Patient being monitored Patient Re-evaluated:Patient Re-evaluated prior to inductionOxygen Delivery Method: Circle System Utilized Preoxygenation: Pre-oxygenation with 100% oxygen Intubation Type: IV induction Ventilation: Mask ventilation without difficulty LMA: LMA inserted LMA Size: 4.0 Number of attempts: 1 Airway Equipment and Method: bite block Placement Confirmation: positive ETCO2 Tube secured with: Tape Dental Injury: Teeth and Oropharynx as per pre-operative assessment

## 2014-04-01 NOTE — Transfer of Care (Signed)
Immediate Anesthesia Transfer of Care Note  Patient: Tracy ChildesLinda G Sawyer  Procedure(s) Performed: Procedure(s) (LRB): CYSTOSCOPY/HYDRODISTENSION WITH INSTILLATION (N/A)  Patient Location: PACU  Anesthesia Type: General  Level of Consciousness: awake, sedated, patient cooperative and responds to stimulation  Airway & Oxygen Therapy: Patient Spontanous Breathing and Patient connected to face mask oxygen  Post-op Assessment: Report given to PACU RN, Post -op Vital signs reviewed and stable and Patient moving all extremities  Post vital signs: Reviewed and stable  Complications: No apparent anesthesia complications

## 2014-04-01 NOTE — Op Note (Signed)
Preoperative diagnosis: Pelvic pain urinary frequency Postop diagnosis: Pelvic pain urinary frequency Surgery: Bladder hydrodistention and cystoscopy and bladder installation treatment Surgeon: Dr. Lorin PicketScott Randy Whitener Assistant: Dr. Julius BowelsJed Ferguson  The patient consented to the above procedure with the above diagnoses. Extra care was taken with leg positioning. 21 French cystoscope was utilized. Bladder mucosa and trigone were normal. There was no stitch or foreign body or carcinoma. Urethra looked normal. There is no erosion the sling material.  Bladder was hydrodistended to 700 mL  Bladder was emptied and reinspected. There were no glomerulations or findings in keeping with a diagnosis of interstitial cystitis  Bladder was emptied  As a separate procedure with a [place] catheter instilled 15 cc of 0.5% Marcaine and 400 mg a pretty  The patient will be followed as per protocol with a negative hydrodistention

## 2014-04-01 NOTE — Anesthesia Preprocedure Evaluation (Signed)
Anesthesia Evaluation  Patient identified by MRN, date of birth, ID band Patient awake    Reviewed: Allergy & Precautions, H&P , NPO status , Patient's Chart, lab work & pertinent test results  Airway Mallampati: II TM Distance: >3 FB Neck ROM: Full    Dental  (+) Dental Advisory Given   Pulmonary former smoker,  breath sounds clear to auscultation        Cardiovascular negative cardio ROS  Rhythm:Regular Rate:Normal     Neuro/Psych PSYCHIATRIC DISORDERS Depression negative neurological ROS     GI/Hepatic negative GI ROS, Neg liver ROS,   Endo/Other  negative endocrine ROS  Renal/GU negative Renal ROS     Musculoskeletal negative musculoskeletal ROS (+)   Abdominal   Peds  Hematology negative hematology ROS (+)   Anesthesia Other Findings   Reproductive/Obstetrics negative OB ROS                           Anesthesia Physical Anesthesia Plan  ASA: II  Anesthesia Plan: General   Post-op Pain Management:    Induction: Intravenous  Airway Management Planned: LMA  Additional Equipment:   Intra-op Plan:   Post-operative Plan: Extubation in OR  Informed Consent: I have reviewed the patients History and Physical, chart, labs and discussed the procedure including the risks, benefits and alternatives for the proposed anesthesia with the patient or authorized representative who has indicated his/her understanding and acceptance.   Dental advisory given  Plan Discussed with: CRNA  Anesthesia Plan Comments:         Anesthesia Quick Evaluation

## 2014-04-01 NOTE — Interval H&P Note (Signed)
History and Physical Interval Note:  04/01/2014 7:17 AM  Tracy [name]  has presented today for surgery, with the diagnosis of PELVIC PAIN  The various methods of treatment have been discussed with the patient and family. After consideration of risks, benefits and other options for treatment, the patient has consented to  Procedure(s): CYSTOSCOPY/HYDRODISTENSION WITH INSTILLATION (N/A) as a surgical intervention .  The patient's history has been reviewed, patient examined, no change in status, stable for surgery.  I have reviewed the patient's chart and labs.  Questions were answered to the patient's satisfaction.     Brietta Manso A

## 2014-04-02 ENCOUNTER — Encounter (HOSPITAL_BASED_OUTPATIENT_CLINIC_OR_DEPARTMENT_OTHER): Payer: Self-pay | Admitting: Urology

## 2014-04-28 ENCOUNTER — Ambulatory Visit: Payer: BC Managed Care – PPO | Admitting: Family

## 2014-04-30 ENCOUNTER — Telehealth: Payer: Self-pay | Admitting: Family

## 2014-04-30 NOTE — Telephone Encounter (Signed)
Patient Information: ° Caller Name: Tekeisha ° Phone: [phone] ° Patient: [name], [name] ° Gender: Female ° DOB: 07/19/1966 ° Age: 47 Years ° PCP: O'Sullivan, Melissa (Adults only) ° Pregnant: No ° °Office Follow Up: ° Does the office need to follow up with this patient?: No ° Instructions For The Office: N/A ° ° °Symptoms ° Reason For Call & Symptoms: Three episodes of "blacking out" over the last month. Most recent episode occurred last night, 8/11, and included shakiness of hands prior to blacking out, causing patient to drop a glass that shattered on the floor. One episode prior to this involved her falling on her  basement floor resulting in an injury that she describes as "leaving a hole" in her head. PMH of MVP with Toporol management. Report called to Trish who agrees to ED evaluation now and follow up with office after. Patient made aware and is agreeable. Advised another adult should drive. Patient states she didn't mention ealrier due to embarassment, but wanted to report that each episode also involves her urinating on herself. Reassured may make ED aware of this when evaluated. ° Reviewed Health History In EMR: Yes ° Reviewed Medications In EMR: Yes ° Reviewed Allergies In EMR: Yes ° Reviewed Surgeries / Procedures: Yes ° Date of Onset of Symptoms: Unknown °OB / GYN: ° LMP: Unknown ° °Guideline(s) Used: ° Fainting ° °Disposition Per Guideline:  ° Go to ED Now ° °Reason For Disposition Reached:  ° History of heart problems or congestive heart failure ° °Advice Given: ° N/A ° °Patient Will Follow Care Advice: ° YES ° ° °

## 2014-04-30 NOTE — Telephone Encounter (Signed)
Patient Information:  Caller Name: Bonita QuinLinda  Phone: [phone]  Patient: Tracy [name], Tracy [name]  Gender: Female  DOB: 09-10-1966  Age: 48 Years  PCP: Sandford Craze'Sullivan, Melissa (Adults only)  Pregnant: No  Office Follow Up:  Does the office need to follow up with this patient?: No  Instructions For The Office: N/A   Symptoms  Reason For Call & Symptoms: Three episodes of "blacking out" over the last month. Most recent episode occurred last night, 8/11, and included shakiness of hands prior to blacking out, causing patient to drop a glass that shattered on the floor. One episode prior to this involved her falling on her  basement floor resulting in an injury that she describes as "leaving a hole" in her head. PMH of MVP with Toporol management. Report called to Trish who agrees to ED evaluation now and follow up with office after. Patient made aware and is agreeable. Advised another adult should drive. Patient states she didn't mention ealrier due to embarassment, but wanted to report that each episode also involves her urinating on herself. Reassured may make ED aware of this when evaluated.  Reviewed Health History In EMR: Yes  Reviewed Medications In EMR: Yes  Reviewed Allergies In EMR: Yes  Reviewed Surgeries / Procedures: Yes  Date of Onset of Symptoms: Unknown OB / GYN:  LMP: Unknown  Guideline(s) Used:  Fainting  Disposition Per Guideline:   Go to ED Now  Reason For Disposition Reached:   History of heart problems or congestive heart failure  Advice Given:  N/A  Patient Will Follow Care Advice:  YES

## 2014-05-12 NOTE — Telephone Encounter (Signed)
Patient called in stating that she never went to the ER and would like to know what Efraim Kaufmann recommend she do?? Best # [phone]

## 2014-05-13 ENCOUNTER — Emergency Department (HOSPITAL_COMMUNITY): Payer: BC Managed Care – PPO

## 2014-05-13 ENCOUNTER — Encounter (HOSPITAL_COMMUNITY): Payer: Self-pay | Admitting: Emergency Medicine

## 2014-05-13 ENCOUNTER — Emergency Department (HOSPITAL_COMMUNITY)
Admission: EM | Admit: 2014-05-13 | Discharge: 2014-05-13 | Disposition: A | Discharge status: Home / Self Care | Payer: BC Managed Care – PPO | Attending: Emergency Medicine | Admitting: Emergency Medicine

## 2014-05-13 DIAGNOSIS — Z87891 Personal history of nicotine dependence: Secondary | ICD-10-CM | POA: Diagnosis not present

## 2014-05-13 DIAGNOSIS — R011 Cardiac murmur, unspecified: Secondary | ICD-10-CM | POA: Insufficient documentation

## 2014-05-13 DIAGNOSIS — Q602 Renal agenesis, unspecified: Secondary | ICD-10-CM | POA: Insufficient documentation

## 2014-05-13 DIAGNOSIS — Z8679 Personal history of other diseases of the circulatory system: Secondary | ICD-10-CM | POA: Insufficient documentation

## 2014-05-13 DIAGNOSIS — R404 Transient alteration of awareness: Secondary | ICD-10-CM | POA: Diagnosis present

## 2014-05-13 DIAGNOSIS — Z8719 Personal history of other diseases of the digestive system: Secondary | ICD-10-CM | POA: Diagnosis not present

## 2014-05-13 DIAGNOSIS — Q605 Renal hypoplasia, unspecified: Secondary | ICD-10-CM | POA: Diagnosis not present

## 2014-05-13 DIAGNOSIS — G8929 Other chronic pain: Secondary | ICD-10-CM | POA: Insufficient documentation

## 2014-05-13 DIAGNOSIS — Z79899 Other long term (current) drug therapy: Secondary | ICD-10-CM | POA: Insufficient documentation

## 2014-05-13 DIAGNOSIS — Z8742 Personal history of other diseases of the female genital tract: Secondary | ICD-10-CM | POA: Diagnosis not present

## 2014-05-13 DIAGNOSIS — Z872 Personal history of diseases of the skin and subcutaneous tissue: Secondary | ICD-10-CM | POA: Insufficient documentation

## 2014-05-13 DIAGNOSIS — R51 Headache: Secondary | ICD-10-CM | POA: Diagnosis not present

## 2014-05-13 DIAGNOSIS — R55 Syncope and collapse: Secondary | ICD-10-CM | POA: Insufficient documentation

## 2014-05-13 DIAGNOSIS — R32 Unspecified urinary incontinence: Secondary | ICD-10-CM | POA: Diagnosis not present

## 2014-05-13 LAB — COMPREHENSIVE METABOLIC PANEL
ALBUMIN: 4.3 g/dL (ref 3.5–5.2)
ALK PHOS: 88 U/L (ref 39–117)
ALT: 11 U/L (ref 0–35)
ANION GAP: 16 — AB (ref 5–15)
AST: 19 U/L (ref 0–37)
BUN: 9 mg/dL (ref 6–23)
CO2: 24 mEq/L (ref 19–32)
Calcium: 9.6 mg/dL (ref 8.4–10.5)
Chloride: 100 mEq/L (ref 96–112)
Creatinine, Ser: 0.93 mg/dL (ref 0.50–1.10)
GFR calc Af Amer: 83 mL/min — ABNORMAL LOW (ref >90)
GFR calc non Af Amer: 72 mL/min — ABNORMAL LOW (ref >90)
Glucose, Bld: 107 mg/dL — ABNORMAL HIGH (ref 70–99)
Potassium: 4.3 mEq/L (ref 3.7–5.3)
Sodium: 140 mEq/L (ref 137–147)
TOTAL PROTEIN: 7.1 g/dL (ref 6.0–8.3)
Total Bilirubin: 0.7 mg/dL (ref 0.3–1.2)

## 2014-05-13 LAB — URINALYSIS, ROUTINE W REFLEX MICROSCOPIC
Bilirubin Urine: NEGATIVE
Glucose, UA: NEGATIVE mg/dL
HGB URINE DIPSTICK: NEGATIVE
Ketones, ur: NEGATIVE mg/dL
Nitrite: NEGATIVE
Protein, ur: NEGATIVE mg/dL
SPECIFIC GRAVITY, URINE: 1.009 (ref 1.005–1.030)
Urobilinogen, UA: 0.2 mg/dL (ref 0.0–1.0)
pH: 7 (ref 5.0–8.0)

## 2014-05-13 LAB — CBC WITH DIFFERENTIAL/PLATELET
BASOS PCT: 0 % (ref 0–1)
Basophils Absolute: 0 10*3/uL (ref 0.0–0.1)
EOS ABS: 0.1 10*3/uL (ref 0.0–0.7)
Eosinophils Relative: 1 % (ref 0–5)
HCT: 42 % (ref 36.0–46.0)
HEMOGLOBIN: 14.3 g/dL (ref 12.0–15.0)
Lymphocytes Relative: 40 % (ref 12–46)
Lymphs Abs: 2.2 10*3/uL (ref 0.7–4.0)
MCH: 31.5 pg (ref 26.0–34.0)
MCHC: 34 g/dL (ref 30.0–36.0)
MCV: 92.5 fL (ref 78.0–100.0)
MONOS PCT: 10 % (ref 3–12)
Monocytes Absolute: 0.6 10*3/uL (ref 0.1–1.0)
Neutro Abs: 2.6 10*3/uL (ref 1.7–7.7)
Neutrophils Relative %: 49 % (ref 43–77)
PLATELETS: 296 10*3/uL (ref 150–400)
RBC: 4.54 MIL/uL (ref 3.87–5.11)
RDW: 12.5 % (ref 11.5–15.5)
WBC: 5.5 10*3/uL (ref 4.0–10.5)

## 2014-05-13 LAB — TROPONIN I

## 2014-05-13 LAB — URINE MICROSCOPIC-ADD ON

## 2014-05-13 LAB — RAPID URINE DRUG SCREEN, HOSP PERFORMED
Amphetamines: NOT DETECTED
Barbiturates: NOT DETECTED
Benzodiazepines: NOT DETECTED
COCAINE: NOT DETECTED
OPIATES: NOT DETECTED
Tetrahydrocannabinol: NOT DETECTED

## 2014-05-13 NOTE — Telephone Encounter (Signed)
Pt calling again, request to speak to [facility] nurse or CMA

## 2014-05-13 NOTE — Discharge Instructions (Signed)
DO NOT DRIVE until you are finished with all of the followup. Call neurology to be scheduled for appointment. Follow up with your primary doctor in the office this week to consider heart monitoring and possible MRI. If you have any further episodes, call 911 to be to the ER.  Syncope Syncope is a medical term for fainting or passing out. This means you lose consciousness and drop to the ground. People are generally unconscious for less than 5 minutes. You may have some muscle twitches for up to 15 seconds before waking up and returning to normal. Syncope occurs more often in older adults, but it can happen to anyone. While most causes of syncope are not dangerous, syncope can be a sign of a serious medical problem. It is important to seek medical care.  CAUSES  Syncope is caused by a sudden drop in blood flow to the brain. The specific cause is often not determined. Factors that can bring on syncope include:  Taking medicines that lower blood pressure.  Sudden changes in posture, such as standing up quickly.  Taking more medicine than prescribed.  Standing in one place for too long.  Seizure disorders.  Dehydration and excessive exposure to heat.  Low blood sugar (hypoglycemia).  Straining to have a bowel movement.  Heart disease, irregular heartbeat, or other circulatory problems.  Fear, emotional distress, seeing blood, or severe pain. SYMPTOMS  Right before fainting, you may:  Feel dizzy or light-headed.  Feel nauseous.  See all white or all black in your field of vision.  Have cold, clammy skin. DIAGNOSIS  Your health care provider will ask about your symptoms, perform a physical exam, and perform an electrocardiogram (ECG) to record the electrical activity of your heart. Your health care provider may also perform other heart or blood tests to determine the cause of your syncope which may include:  Transthoracic echocardiogram (TTE). During echocardiography, sound waves are  used to evaluate how blood flows through your heart.  Transesophageal echocardiogram (TEE).  Cardiac monitoring. This allows your health care provider to monitor your heart rate and rhythm in real time.  Holter monitor. This is a portable device that records your heartbeat and can help diagnose heart arrhythmias. It allows your health care provider to track your heart activity for several days, if needed.  Stress tests by exercise or by giving medicine that makes the heart beat faster. TREATMENT  In most cases, no treatment is needed. Depending on the cause of your syncope, your health care provider may recommend changing or stopping some of your medicines. HOME CARE INSTRUCTIONS  Have someone stay with you until you feel stable.  Do not drive, use machinery, or play sports until your health care provider says it is okay.  Keep all follow-up appointments as directed by your health care provider.  Lie down right away if you start feeling like you might faint. Breathe deeply and steadily. Wait until all the symptoms have passed.  Drink enough fluids to keep your urine clear or pale yellow.  If you are taking blood pressure or heart medicine, get up slowly and take several minutes to sit and then stand. This can reduce dizziness. SEEK IMMEDIATE MEDICAL CARE IF:   You have a severe headache.  You have unusual pain in the chest, abdomen, or back.  You are bleeding from your mouth or rectum, or you have black or tarry stool.  You have an irregular or very fast heartbeat.  You have pain with breathing.  You have repeated fainting or seizure-like jerking during an episode.  You faint when sitting or lying down.  You have confusion.  You have trouble walking.  You have severe weakness.  You have vision problems. If you fainted, call your local emergency services (911 in [place] Do not drive yourself to the hospital.  MAKE SURE YOU:  Understand these instructions.  Will watch  your condition.  Will get help right away if you are not doing well or get worse. Document Released: 09/05/2005 Document Revised: 09/10/2013 Document Reviewed: 11/04/2011 [facility] Patient Information 2015 [place], [place]. This information is not intended to replace advice given to you by your health care provider. Make sure you discuss any questions you have with your health care provider. Seizure, Adult A seizure is abnormal electrical activity in the brain. Seizures usually last from 30 seconds to 2 minutes. There are various types of seizures. Before a seizure, you may have a warning sensation (aura) that a seizure is about to occur. An aura may include the following symptoms:   Fear or anxiety.  Nausea.  Feeling like the room is spinning (vertigo).  Vision changes, such as seeing flashing lights or spots. Common symptoms during a seizure include:  A change in attention or behavior (altered mental status).  Convulsions with rhythmic jerking movements.  Drooling.  Rapid eye movements.  Grunting.  Loss of bladder and bowel control.  Bitter taste in the mouth.  Tongue biting. After a seizure, you may feel confused and sleepy. You may also have an injury resulting from convulsions during the seizure. HOME CARE INSTRUCTIONS   If you are given medicines, take them exactly as prescribed by your health care provider.  Keep all follow-up appointments as directed by your health care provider.  Do not swim or drive or engage in risky activity during which a seizure could cause further injury to you or others until your health care provider says it is OK.  Get adequate rest.  Teach friends and family what to do if you have a seizure. They should:  Lay you on the ground to prevent a fall.  Put a cushion under your head.  Loosen any tight clothing around your neck.  Turn you on your side. If vomiting occurs, this helps keep your airway clear.  Stay with you until you  recover.  Know whether or not you need emergency care. SEEK IMMEDIATE MEDICAL CARE IF:  The seizure lasts longer than 5 minutes.  The seizure is severe or you do not wake up immediately after the seizure.  You have an altered mental status after the seizure.  You are having more frequent or worsening seizures. Someone should drive you to the emergency department or call local emergency services (911 in [place] MAKE SURE YOU:  Understand these instructions.  Will watch your condition.  Will get help right away if you are not doing well or get worse. Document Released: 09/02/2000 Document Revised: 06/26/2013 Document Reviewed: 04/17/2013 [facility] Patient Information 2015 [place], [place]. This information is not intended to replace advice given to you by your health care provider. Make sure you discuss any questions you have with your health care provider.

## 2014-05-13 NOTE — ED Notes (Signed)
MD at bedside. 

## 2014-05-13 NOTE — Telephone Encounter (Signed)
Attempted to reach pt. Upon review of EPIC it appears that pt went to the ER.  Please call pt later to arrange ER follow up.

## 2014-05-13 NOTE — Telephone Encounter (Signed)
Patient needs to be evaluated in the office this week.

## 2014-05-13 NOTE — ED Notes (Signed)
Pt alert, arrives from home,c/o "passing out", onset several weeks ago, last episode was last weekend, pt states when episode occur she feels nausea and headache
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
## 2014-05-13 NOTE — ED Notes (Signed)
Pt taken to CT.

## 2014-05-13 NOTE — ED Notes (Signed)
Pt c/o headaches and nausea and fainting spells x 6 weeks.  Pt also c/o muscle spasms in both arms (pt unsure if it's related to other symptoms).  Fainting occurs once a week.  Pt states she does not remember what happens but wakes up and at times has a bump on the head and bloody nose.  Pt states others find her face down.  Pt states headache is nearly constant, but is worse with fainting, and nausea occurs after fainting episode.  NAD, respirations equal and unlabored, skin warm and dry.

## 2014-05-15 ENCOUNTER — Ambulatory Visit (INDEPENDENT_AMBULATORY_CARE_PROVIDER_SITE_OTHER): Payer: BC Managed Care – PPO | Admitting: Neurology

## 2014-05-15 ENCOUNTER — Encounter: Payer: Self-pay | Admitting: Neurology

## 2014-05-15 ENCOUNTER — Ambulatory Visit (INDEPENDENT_AMBULATORY_CARE_PROVIDER_SITE_OTHER): Payer: BC Managed Care – PPO

## 2014-05-15 VITALS — BP 105/70 | HR 66 | Ht 67.0 in | Wt 148.0 lb

## 2014-05-15 DIAGNOSIS — R4020 Unspecified coma: Secondary | ICD-10-CM

## 2014-05-15 DIAGNOSIS — R404 Transient alteration of awareness: Secondary | ICD-10-CM

## 2014-05-15 MED ORDER — TOPIRAMATE 100 MG PO TABS: ORAL_TABLET | ORAL | Status: DC
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
## 2014-05-19 ENCOUNTER — Telehealth: Payer: Self-pay | Admitting: Neurology

## 2014-05-19 NOTE — Telephone Encounter (Signed)
Left message for patient to go to the ED if she feels that this is something urgent.  Told her I will forward her message to the doctor, and check on her tomorrow to get clarification on exactly what she is experiencing.

## 2014-05-19 NOTE — Telephone Encounter (Signed)
Patient stated she was unresponsive for a few minutes today.  Questioning if medication topiramate (TOPAMAX) 100 MG tablet hasn't started working yet.  Questioning if she needs to go to hospital.  Please call anytime and may leave detailed message on vm.

## 2014-05-19 NOTE — Telephone Encounter (Signed)
Patient calling back, please return call.

## 2014-05-20 ENCOUNTER — Encounter (HOSPITAL_COMMUNITY): Payer: Self-pay | Admitting: Emergency Medicine

## 2014-05-20 ENCOUNTER — Emergency Department (HOSPITAL_COMMUNITY)
Admission: EM | Admit: 2014-05-20 | Discharge: 2014-05-20 | Disposition: A | Discharge status: Home / Self Care | Payer: BC Managed Care – PPO | Attending: Emergency Medicine | Admitting: Emergency Medicine

## 2014-05-20 DIAGNOSIS — Z862 Personal history of diseases of the blood and blood-forming organs and certain disorders involving the immune mechanism: Secondary | ICD-10-CM | POA: Insufficient documentation

## 2014-05-20 DIAGNOSIS — Z8739 Personal history of other diseases of the musculoskeletal system and connective tissue: Secondary | ICD-10-CM | POA: Diagnosis not present

## 2014-05-20 DIAGNOSIS — F17200 Nicotine dependence, unspecified, uncomplicated: Secondary | ICD-10-CM | POA: Diagnosis not present

## 2014-05-20 DIAGNOSIS — Z79899 Other long term (current) drug therapy: Secondary | ICD-10-CM | POA: Insufficient documentation

## 2014-05-20 DIAGNOSIS — R569 Unspecified convulsions: Secondary | ICD-10-CM | POA: Insufficient documentation

## 2014-05-20 DIAGNOSIS — R011 Cardiac murmur, unspecified: Secondary | ICD-10-CM | POA: Insufficient documentation

## 2014-05-20 DIAGNOSIS — Z8679 Personal history of other diseases of the circulatory system: Secondary | ICD-10-CM | POA: Diagnosis not present

## 2014-05-20 DIAGNOSIS — G8929 Other chronic pain: Secondary | ICD-10-CM | POA: Diagnosis not present

## 2014-05-20 DIAGNOSIS — Z8639 Personal history of other endocrine, nutritional and metabolic disease: Secondary | ICD-10-CM | POA: Diagnosis not present

## 2014-05-20 DIAGNOSIS — Z8719 Personal history of other diseases of the digestive system: Secondary | ICD-10-CM | POA: Insufficient documentation

## 2014-05-20 DIAGNOSIS — Z8742 Personal history of other diseases of the female genital tract: Secondary | ICD-10-CM | POA: Insufficient documentation

## 2014-05-20 DIAGNOSIS — Q602 Renal agenesis, unspecified: Secondary | ICD-10-CM | POA: Insufficient documentation

## 2014-05-20 DIAGNOSIS — Z872 Personal history of diseases of the skin and subcutaneous tissue: Secondary | ICD-10-CM | POA: Diagnosis not present

## 2014-05-20 DIAGNOSIS — Q605 Renal hypoplasia, unspecified: Secondary | ICD-10-CM

## 2014-05-20 LAB — URINE MICROSCOPIC-ADD ON

## 2014-05-20 LAB — BASIC METABOLIC PANEL
Anion gap: 18 — ABNORMAL HIGH (ref 5–15)
BUN: 17 mg/dL (ref 6–23)
CO2: 18 meq/L — AB (ref 19–32)
Calcium: 9.6 mg/dL (ref 8.4–10.5)
Chloride: 103 mEq/L (ref 96–112)
Creatinine, Ser: 1.05 mg/dL (ref 0.50–1.10)
GFR calc Af Amer: 72 mL/min — ABNORMAL LOW (ref >90)
GFR, EST NON AFRICAN AMERICAN: 62 mL/min — AB (ref >90)
GLUCOSE: 82 mg/dL (ref 70–99)
POTASSIUM: 3.7 meq/L (ref 3.7–5.3)
SODIUM: 139 meq/L (ref 137–147)

## 2014-05-20 LAB — URINALYSIS, ROUTINE W REFLEX MICROSCOPIC
Bilirubin Urine: NEGATIVE
GLUCOSE, UA: NEGATIVE mg/dL
HGB URINE DIPSTICK: NEGATIVE
Ketones, ur: 15 mg/dL — AB
Nitrite: NEGATIVE
PROTEIN: NEGATIVE mg/dL
Specific Gravity, Urine: 1.012 (ref 1.005–1.030)
Urobilinogen, UA: 0.2 mg/dL (ref 0.0–1.0)
pH: 5 (ref 5.0–8.0)

## 2014-05-20 LAB — RAPID URINE DRUG SCREEN, HOSP PERFORMED
AMPHETAMINES: NOT DETECTED
Barbiturates: NOT DETECTED
Benzodiazepines: NOT DETECTED
COCAINE: NOT DETECTED
Opiates: NOT DETECTED
Tetrahydrocannabinol: NOT DETECTED

## 2014-05-20 LAB — CBC
HCT: 43.1 % (ref 36.0–46.0)
HEMOGLOBIN: 14.8 g/dL (ref 12.0–15.0)
MCH: 31.8 pg (ref 26.0–34.0)
MCHC: 34.3 g/dL (ref 30.0–36.0)
MCV: 92.7 fL (ref 78.0–100.0)
Platelets: 256 10*3/uL (ref 150–400)
RBC: 4.65 MIL/uL (ref 3.87–5.11)
RDW: 12.6 % (ref 11.5–15.5)
WBC: 6.4 10*3/uL (ref 4.0–10.5)

## 2014-05-20 LAB — CBG MONITORING, ED: GLUCOSE-CAPILLARY: 72 mg/dL (ref 70–99)

## 2014-05-20 LAB — ETHANOL: Alcohol, Ethyl (B): <11 mg/dL (ref 0–11)
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
## 2014-05-20 NOTE — Telephone Encounter (Addendum)
She continues to have recurrent episodes, swimming like movement, was evaluated by neurohospitalist at ED,   I have talked with Neurologist PA Onalee Hua, suspicious for pseudoseizure, MRI brain is pending.  Tracy [name], give her a follow up visit, after MRI brain w/wo, also give me paper work for 24-hour out patient Video-EEG monitoring
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
## 2014-05-20 NOTE — ED Notes (Signed)
While leaving [employer] had a full grand mal seizure for approx 3 min and post ictal for 10 min per daughter. Has seen neuro and has been seen here for this samething they added her a med yesterday. Is scheduled for MRI tomorrow. Pt did complain that the sun seemed to be bright prior to seizure. Alert to person and place. Does have a headache and has a hx of chronic seizures.

## 2014-05-20 NOTE — Procedures (Signed)
   HISTORY: 48 years old female, was recurrent passing out episode, syncope vs. seizure  TECHNIQUE:  16 channel EEG was performed based on standard 10-16 international system. One channel was dedicated to EKG, which has demonstrates normal sinus rhythm.  Upon awakening, the posterior background activity was well-developed, in alpha range, 10 Hz, with amplitude of 35 microvoltage, reactive to eye opening and closure.  There was no evidence of epileptiform discharge.  Photic stimulation was performed, which induced a symmetric photic driving.  Hyperventilation was performed, there was no abnormality elicit.  No sleep was achieved.  CONCLUSION: This is a  normal awake EEG.  There is no electrodiagnostic evidence of epileptiform discharge

## 2014-05-20 NOTE — Discharge Instructions (Signed)
You should increase the topamax to  by mouth twice daily  You are to have MRI of the brain as scheduled tomorrow  You should followup with Dr. Terrace Arabia, neurology on Thursday 05/22/14 and will have an ambulatory EEG started that day  Return to the ED with any concerns including changes in vision or speech, fever/chills, difficulty breathing, vomiting and not able to keep down liquids, decreased level of alertness/lethargy, or any other alarming symptoms

## 2014-05-20 NOTE — Telephone Encounter (Signed)
I spoke to [employer]. Patient is scheduled to see Dr.Yan 05-22-2014. And I will fax video EEG to [facility].

## 2014-05-20 NOTE — Telephone Encounter (Signed)
Spoke to patient and daughter and they are in the ER at [facility].  The patient has been having seizures about every 12 hours according to the daughter.  She is not remembering after seizure and also coming out of seizure very disoriented.  Todays seizure was particularly intense so they went to the ER, and daughter is not sure she will be admitted.  Also she has her MRI scheduled for tomorrow morning and if she is admitted won't be able to make it.    Patient's daughter also asked if they can get an appointment sooner than 06-16-14.  Please advise.

## 2014-05-21 ENCOUNTER — Other Ambulatory Visit: Payer: BC Managed Care – PPO

## 2014-05-22 ENCOUNTER — Ambulatory Visit: Payer: Self-pay | Admitting: Neurology

## 2014-05-22 DIAGNOSIS — F449 Dissociative and conversion disorder, unspecified: Secondary | ICD-10-CM | POA: Insufficient documentation

## 2014-05-28 ENCOUNTER — Encounter: Payer: Self-pay | Admitting: Neurology

## 2014-05-28 ENCOUNTER — Ambulatory Visit (INDEPENDENT_AMBULATORY_CARE_PROVIDER_SITE_OTHER): Payer: BC Managed Care – PPO | Admitting: Neurology

## 2014-05-28 VITALS — BP 99/70 | HR 74 | Ht 67.0 in | Wt 145.0 lb

## 2014-05-28 DIAGNOSIS — R4020 Unspecified coma: Secondary | ICD-10-CM

## 2014-05-28 DIAGNOSIS — R404 Transient alteration of awareness: Secondary | ICD-10-CM

## 2014-05-28 MED ORDER — CITALOPRAM HYDROBROMIDE 10 MG PO TABS: ORAL_TABLET | ORAL | Status: DC
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
## 2014-06-02 NOTE — Telephone Encounter (Signed)
Lm on vm to return call

## 2014-06-16 ENCOUNTER — Ambulatory Visit: Payer: BC Managed Care – PPO | Admitting: Neurology

## 2014-07-03 ENCOUNTER — Ambulatory Visit (INDEPENDENT_AMBULATORY_CARE_PROVIDER_SITE_OTHER): Payer: BC Managed Care – PPO | Admitting: Psychiatry

## 2014-07-03 ENCOUNTER — Encounter (HOSPITAL_COMMUNITY): Payer: Self-pay | Admitting: Psychiatry

## 2014-07-03 VITALS — BP 119/75 | HR 65 | Ht 66.0 in | Wt 138.0 lb

## 2014-07-03 DIAGNOSIS — F331 Major depressive disorder, recurrent, moderate: Secondary | ICD-10-CM

## 2014-07-03 DIAGNOSIS — F410 Panic disorder [episodic paroxysmal anxiety] without agoraphobia: Secondary | ICD-10-CM

## 2014-07-03 DIAGNOSIS — F4310 Post-traumatic stress disorder, unspecified: Secondary | ICD-10-CM

## 2014-07-03 MED ORDER — ESCITALOPRAM 5 MG HALF TABLET: ORAL_TABLET | ORAL | Status: DC

## 2014-07-03 NOTE — Patient Instructions (Signed)
Avoid caffeine. Cut down on smoking. Would recommend individual therapy
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
## 2014-07-08 ENCOUNTER — Telehealth: Payer: Self-pay | Admitting: *Deleted

## 2014-07-08 NOTE — Telephone Encounter (Signed)
Received medical record request via mail from [employer]. Forms forwarded to [facility]. JG//CMA 

## 2014-07-23 ENCOUNTER — Telehealth: Payer: Self-pay | Admitting: *Deleted

## 2014-07-23 NOTE — Telephone Encounter (Signed)
Medical record request received via mail from [employer]. [facility] JG[facility]

## 2014-07-28 ENCOUNTER — Other Ambulatory Visit (HOSPITAL_COMMUNITY): Payer: Self-pay | Admitting: Psychiatry

## 2014-07-31 ENCOUNTER — Ambulatory Visit (INDEPENDENT_AMBULATORY_CARE_PROVIDER_SITE_OTHER): Payer: BC Managed Care – PPO | Admitting: Psychiatry

## 2014-07-31 ENCOUNTER — Encounter (HOSPITAL_COMMUNITY): Payer: Self-pay | Admitting: Psychiatry

## 2014-07-31 VITALS — HR 88 | Ht 67.0 in | Wt 134.0 lb

## 2014-07-31 DIAGNOSIS — F331 Major depressive disorder, recurrent, moderate: Secondary | ICD-10-CM

## 2014-07-31 DIAGNOSIS — F4310 Post-traumatic stress disorder, unspecified: Secondary | ICD-10-CM

## 2014-07-31 DIAGNOSIS — F410 Panic disorder [episodic paroxysmal anxiety] without agoraphobia: Secondary | ICD-10-CM

## 2014-07-31 MED ORDER — ESCITALOPRAM OXALATE 10 MG PO TABS: 10.0000 mg | ORAL_TABLET | Freq: Every day | ORAL | Status: DC
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
## 2014-08-11 ENCOUNTER — Emergency Department (HOSPITAL_COMMUNITY): Payer: BC Managed Care – PPO

## 2014-08-11 ENCOUNTER — Emergency Department (HOSPITAL_COMMUNITY)
Admission: EM | Admit: 2014-08-11 | Discharge: 2014-08-11 | Disposition: A | Discharge status: Home / Self Care | Payer: BC Managed Care – PPO | Attending: Emergency Medicine | Admitting: Emergency Medicine

## 2014-08-11 ENCOUNTER — Encounter (HOSPITAL_COMMUNITY): Payer: Self-pay | Admitting: *Deleted

## 2014-08-11 DIAGNOSIS — R011 Cardiac murmur, unspecified: Secondary | ICD-10-CM | POA: Insufficient documentation

## 2014-08-11 DIAGNOSIS — S60212A Contusion of left wrist, initial encounter: Secondary | ICD-10-CM

## 2014-08-11 DIAGNOSIS — Y998 Other external cause status: Secondary | ICD-10-CM | POA: Insufficient documentation

## 2014-08-11 DIAGNOSIS — Y9389 Activity, other specified: Secondary | ICD-10-CM | POA: Insufficient documentation

## 2014-08-11 DIAGNOSIS — Z720 Tobacco use: Secondary | ICD-10-CM | POA: Insufficient documentation

## 2014-08-11 DIAGNOSIS — Z87448 Personal history of other diseases of urinary system: Secondary | ICD-10-CM | POA: Diagnosis not present

## 2014-08-11 DIAGNOSIS — W2203XA Walked into furniture, initial encounter: Secondary | ICD-10-CM | POA: Diagnosis not present

## 2014-08-11 DIAGNOSIS — G8929 Other chronic pain: Secondary | ICD-10-CM | POA: Insufficient documentation

## 2014-08-11 DIAGNOSIS — S60211A Contusion of right wrist, initial encounter: Secondary | ICD-10-CM | POA: Diagnosis not present

## 2014-08-11 DIAGNOSIS — F329 Major depressive disorder, single episode, unspecified: Secondary | ICD-10-CM | POA: Insufficient documentation

## 2014-08-11 DIAGNOSIS — Z872 Personal history of diseases of the skin and subcutaneous tissue: Secondary | ICD-10-CM | POA: Insufficient documentation

## 2014-08-11 DIAGNOSIS — S6991XA Unspecified injury of right wrist, hand and finger(s), initial encounter: Secondary | ICD-10-CM | POA: Diagnosis present

## 2014-08-11 DIAGNOSIS — Z8639 Personal history of other endocrine, nutritional and metabolic disease: Secondary | ICD-10-CM | POA: Insufficient documentation

## 2014-08-11 DIAGNOSIS — Z79899 Other long term (current) drug therapy: Secondary | ICD-10-CM | POA: Insufficient documentation

## 2014-08-11 DIAGNOSIS — Y929 Unspecified place or not applicable: Secondary | ICD-10-CM | POA: Diagnosis not present

## 2014-08-11 DIAGNOSIS — R609 Edema, unspecified: Secondary | ICD-10-CM

## 2014-08-11 DIAGNOSIS — F419 Anxiety disorder, unspecified: Secondary | ICD-10-CM | POA: Diagnosis not present

## 2014-08-11 MED ORDER — IBUPROFEN 400 MG PO TABS
800.0000 mg | ORAL_TABLET | Freq: Once | ORAL | Status: AC
  Administered 2014-08-11: 800 mg via ORAL
  Filled 2014-08-11: qty 2
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
## 2014-08-11 NOTE — ED Notes (Signed)
To ED for eval of right wrist swelling past punching a chair. Pt able to move digits. Ring removed due to swelling
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
## 2014-08-11 NOTE — Discharge Instructions (Signed)
1. Medications: usual home medications, ibuprofen as needed for pain 2. Treatment: rest, drink plenty of fluids,  3. Follow Up: Please followup with your primary doctor in 3-5 days for discussion of your diagnoses and further evaluation after today's visit; if you do not have a primary care doctor use the resource guide provided to find one; Please return to the ER for worsening symptoms   Contusion A contusion is a deep bruise. Contusions are the result of an injury that caused bleeding under the skin. The contusion may turn blue, purple, or yellow. Minor injuries will give you a painless contusion, but more severe contusions may stay painful and swollen for a few weeks.  CAUSES  A contusion is usually caused by a blow, trauma, or direct force to an area of the body. SYMPTOMS   Swelling and redness of the injured area.  Bruising of the injured area.  Tenderness and soreness of the injured area.  Pain. DIAGNOSIS  The diagnosis can be made by taking a history and physical exam. An X-ray, CT scan, or MRI may be needed to determine if there were any associated injuries, such as fractures. TREATMENT  Specific treatment will depend on what area of the body was injured. In general, the best treatment for a contusion is resting, icing, elevating, and applying cold compresses to the injured area. Over-the-counter medicines may also be recommended for pain control. Ask your caregiver what the best treatment is for your contusion. HOME CARE INSTRUCTIONS   Put ice on the injured area.  Put ice in a plastic bag.  Place a towel between your skin and the bag.  Leave the ice on for 15-20 minutes, 3-4 times a day, or as directed by your health care provider.  Only take over-the-counter or prescription medicines for pain, discomfort, or fever as directed by your caregiver. Your caregiver may recommend avoiding anti-inflammatory medicines (aspirin, ibuprofen, and naproxen) for 48 hours because these  medicines may increase bruising.  Rest the injured area.  If possible, elevate the injured area to reduce swelling. SEEK IMMEDIATE MEDICAL CARE IF:   You have increased bruising or swelling.  You have pain that is getting worse.  Your swelling or pain is not relieved with medicines. MAKE SURE YOU:   Understand these instructions.  Will watch your condition.  Will get help right away if you are not doing well or get worse. Document Released: 06/15/2005 Document Revised: 09/10/2013 Document Reviewed: 07/11/2011 [facility] Patient Information 2015 [employer], [place]. This information is not intended to replace advice given to you by your health care provider. Make sure you discuss any questions you have with your health care provider.

## 2014-08-11 NOTE — ED Notes (Signed)
Ice pack given to pt for wrist injury

## 2014-08-28 ENCOUNTER — Ambulatory Visit (INDEPENDENT_AMBULATORY_CARE_PROVIDER_SITE_OTHER): Payer: BC Managed Care – PPO | Admitting: Licensed Clinical Social Worker

## 2014-08-28 DIAGNOSIS — F4310 Post-traumatic stress disorder, unspecified: Secondary | ICD-10-CM
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
## 2014-09-01 ENCOUNTER — Encounter (HOSPITAL_COMMUNITY): Payer: Self-pay | Admitting: Psychiatry

## 2014-09-01 ENCOUNTER — Ambulatory Visit (INDEPENDENT_AMBULATORY_CARE_PROVIDER_SITE_OTHER): Payer: BC Managed Care – PPO | Admitting: Psychiatry

## 2014-09-01 VITALS — BP 129/75 | HR 64 | Ht 67.0 in | Wt 140.0 lb

## 2014-09-01 DIAGNOSIS — F410 Panic disorder [episodic paroxysmal anxiety] without agoraphobia: Secondary | ICD-10-CM

## 2014-09-01 DIAGNOSIS — F331 Major depressive disorder, recurrent, moderate: Secondary | ICD-10-CM

## 2014-09-01 DIAGNOSIS — F4310 Post-traumatic stress disorder, unspecified: Secondary | ICD-10-CM

## 2014-09-01 MED ORDER — ESCITALOPRAM OXALATE 10 MG PO TABS: 10.0000 mg | ORAL_TABLET | Freq: Every day | ORAL | Status: DC
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
## 2014-09-05 ENCOUNTER — Ambulatory Visit (HOSPITAL_COMMUNITY): Payer: Self-pay | Admitting: Licensed Clinical Social Worker

## 2014-09-16 ENCOUNTER — Encounter: Payer: Self-pay | Admitting: Nurse Practitioner

## 2014-09-16 ENCOUNTER — Ambulatory Visit (INDEPENDENT_AMBULATORY_CARE_PROVIDER_SITE_OTHER): Payer: BC Managed Care – PPO | Admitting: Nurse Practitioner

## 2014-09-16 VITALS — BP 101/68 | HR 61 | Temp 97.0°F | Resp 20 | Ht 67.0 in | Wt 135.0 lb

## 2014-09-16 DIAGNOSIS — F331 Major depressive disorder, recurrent, moderate: Secondary | ICD-10-CM

## 2014-09-16 DIAGNOSIS — G4089 Other seizures: Secondary | ICD-10-CM

## 2014-09-16 DIAGNOSIS — F445 Conversion disorder with seizures or convulsions: Secondary | ICD-10-CM | POA: Insufficient documentation

## 2014-09-16 MED ORDER — TOPIRAMATE 100 MG PO TABS: 100.0000 mg | ORAL_TABLET | Freq: Two times a day (BID) | ORAL | Status: DC
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
## 2014-09-16 NOTE — Patient Instructions (Signed)
Continue Topamax at current dose will refill Continue Lexapro 10 mg her psychiatrist Return to the clinic in 4-6 months Continue to keep a record of events

## 2014-09-17 NOTE — Progress Notes (Signed)
I agree above plan. 

## 2014-09-26 NOTE — Progress Notes (Signed)
I agree above plan. 

## 2014-10-07 ENCOUNTER — Ambulatory Visit (HOSPITAL_COMMUNITY): Payer: Self-pay | Admitting: Psychiatry

## 2015-01-05 ENCOUNTER — Encounter: Payer: Self-pay | Admitting: Family Medicine

## 2015-02-06 DIAGNOSIS — Z0289 Encounter for other administrative examinations: Secondary | ICD-10-CM

## 2015-03-18 ENCOUNTER — Ambulatory Visit: Payer: BC Managed Care – PPO | Admitting: Nurse Practitioner

## 2015-09-16 IMAGING — DX DG WRIST COMPLETE 3+V*R*
4 series · 4 of 4 positions shown · non-contrast
Comparison: Forearm films 07/14/2006

CLINICAL DATA: Pain with swelling medially after trauma 3 hr ago.
NE015: R 60.9.

EXAM:
RIGHT WRIST - COMPLETE 3+ VIEW

[wrist ap]
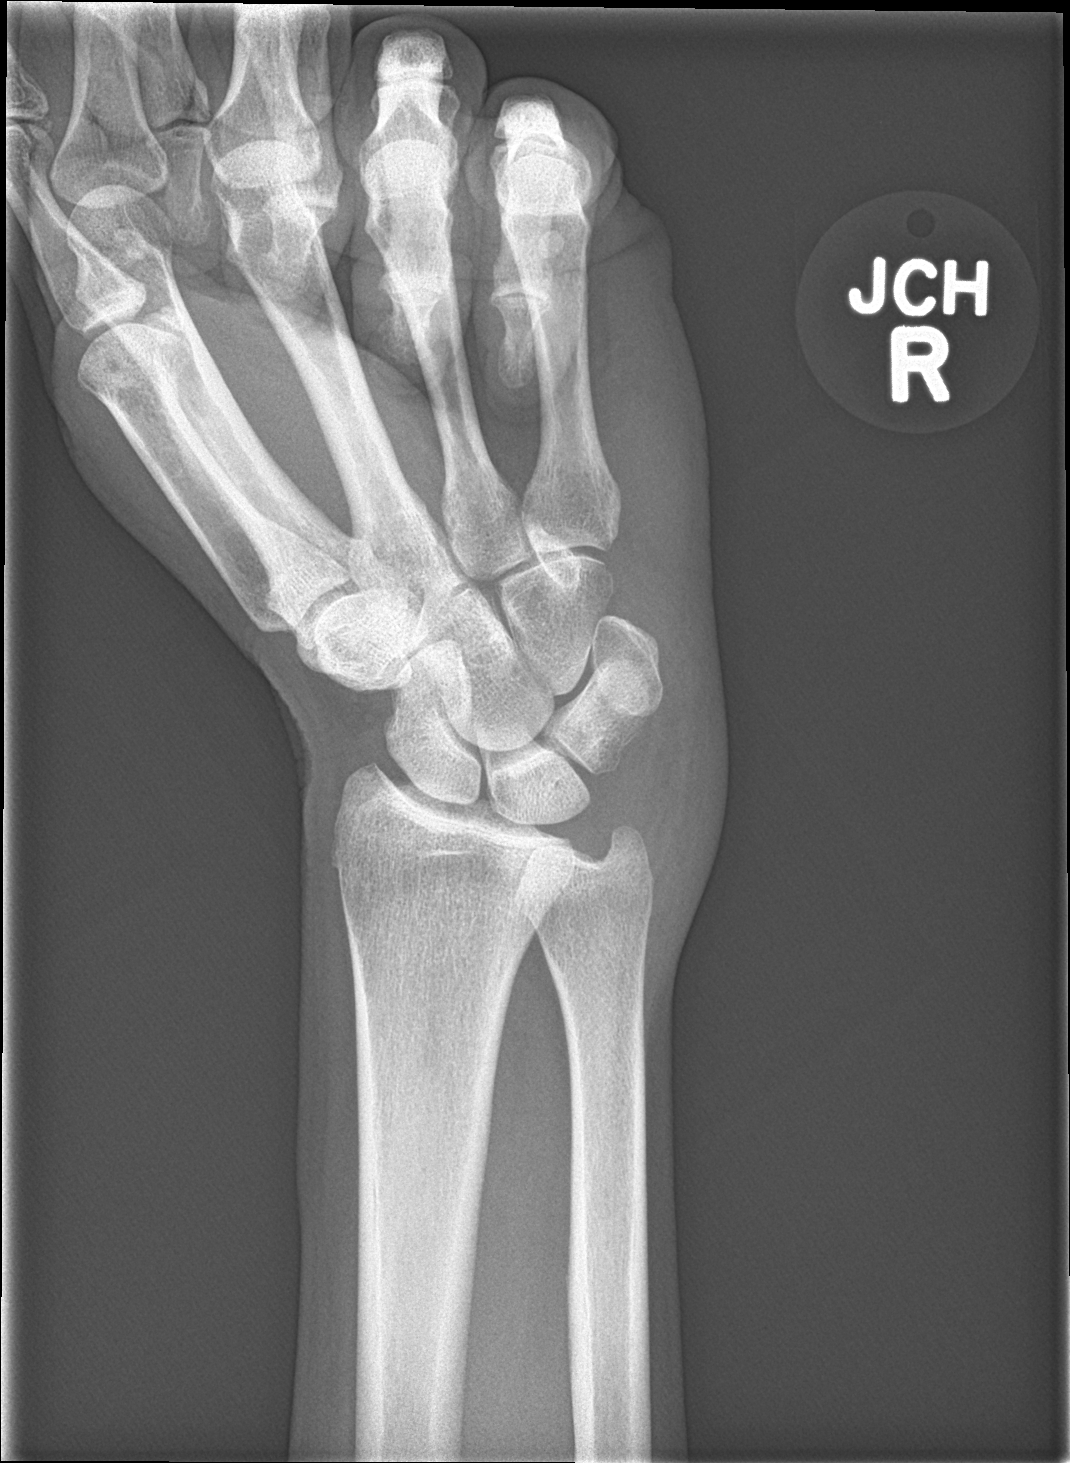

[wrist obl]
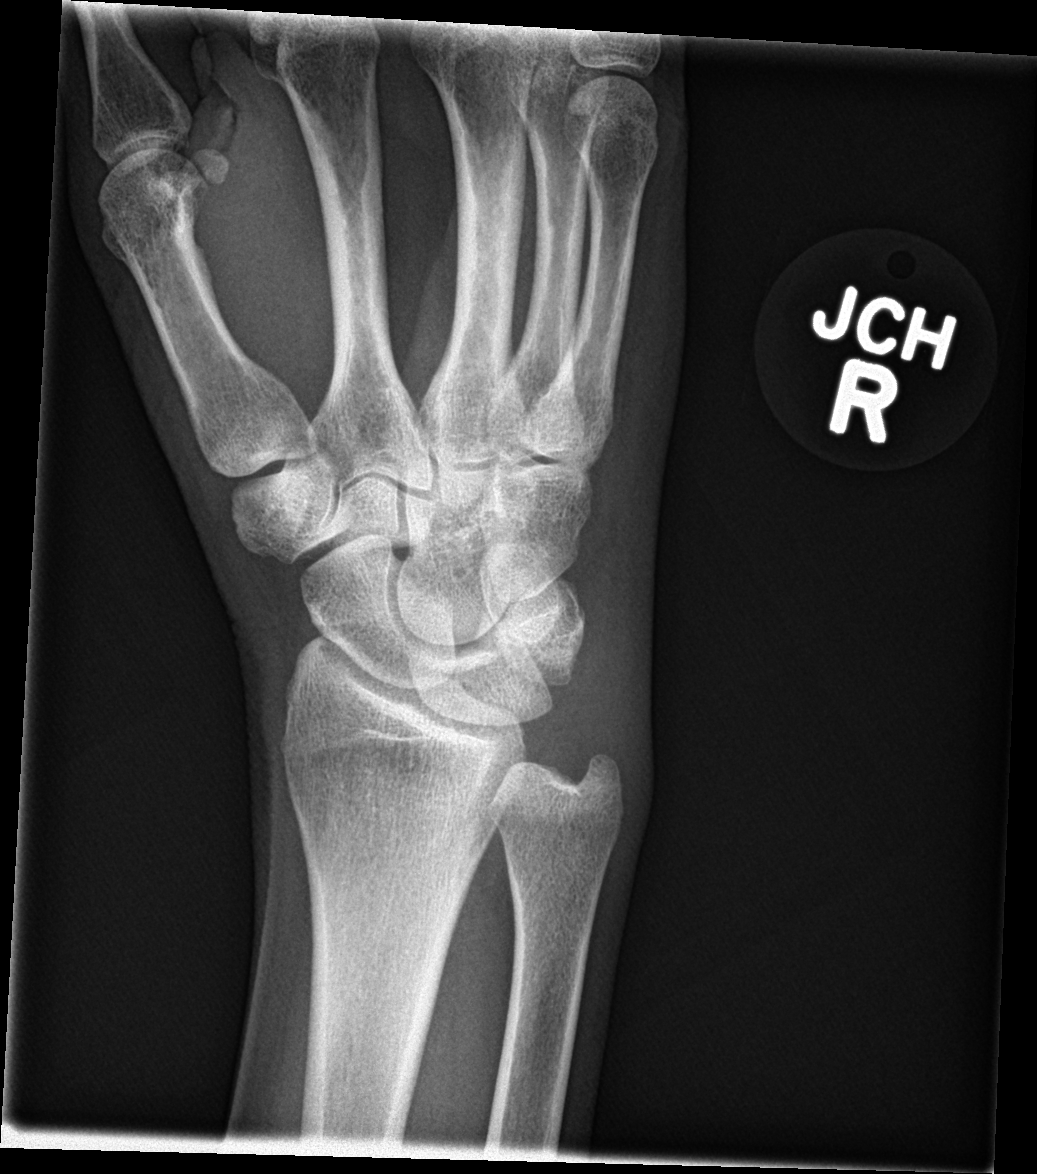

[wrist lat]
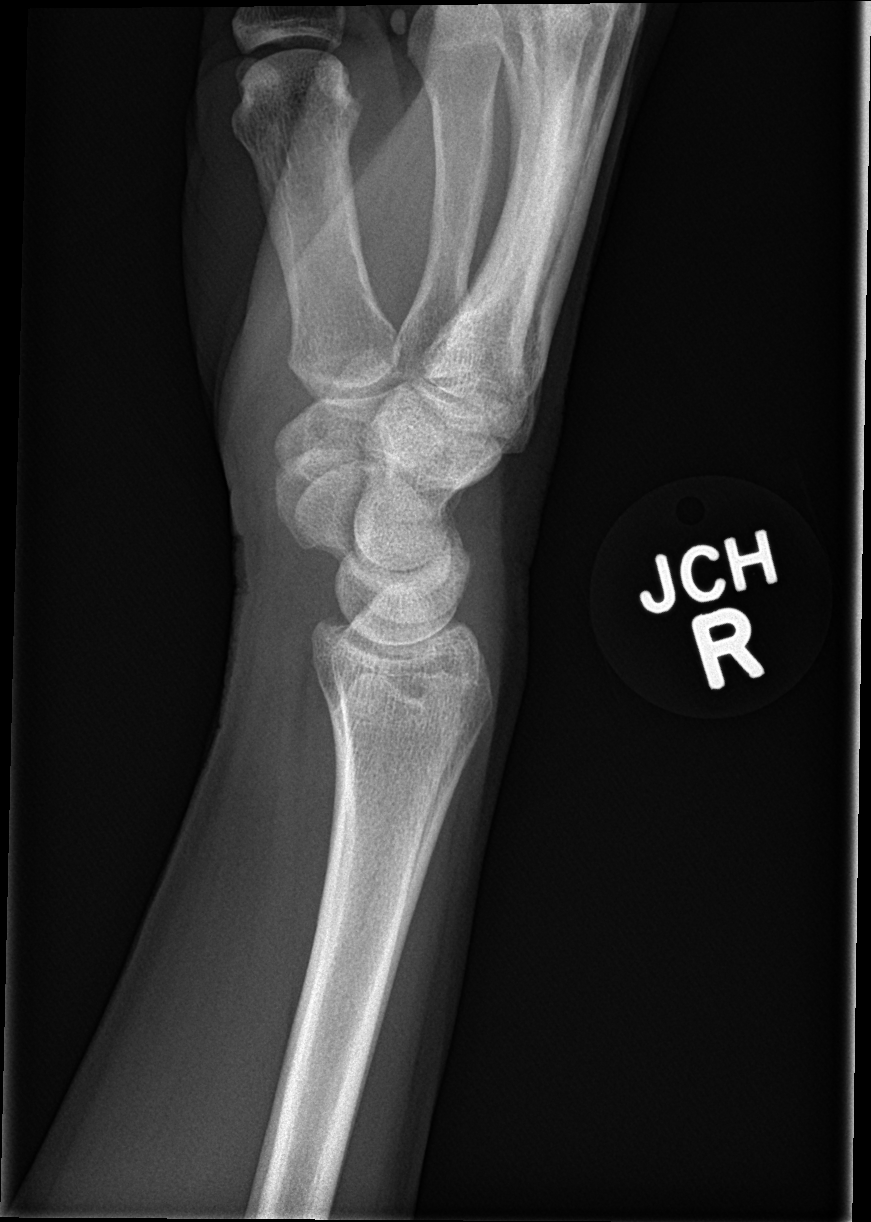

[wrist navicular]
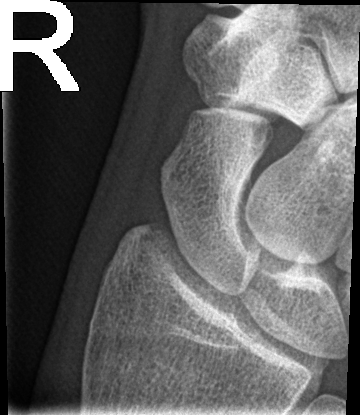

[4 of 4 positions shown; findings below may reference images not displayed]

FINDINGS: Scaphoid intact. No acute fracture or dislocation. Soft tissue
swelling identified distal to the ulnar styloid.
IMPRESSION: Soft tissue swelling, without acute osseous abnormality.

## 2016-02-12 ENCOUNTER — Telehealth: Payer: Self-pay | Admitting: Family

## 2016-02-12 NOTE — Telephone Encounter (Signed)
Called to follow-up with the patient regarding the below symptoms. Patient stated, "I've spoken to the nurse and was told to come in the office today, but I'm working right now". Currently, she does not report any symptoms; voicing that they come & go. Patient declined appointment at this time, but verbalized that she will give the office a call back if things change.

## 2016-02-12 NOTE — Telephone Encounter (Signed)
[facility] Day - Client TELEPHONE ADVICE RECORD [facility] Patient Name: Tracy BridegroomLINDA [name] DOB: 12/04/1965 Initial Comment Caller states she's dizzy, she has a numbness feeling come over her head. 151/91-BP. Nurse Assessment Nurse: Debera Latalston, RN, Tinnie GensJeffrey Date/Time [name](Eastern Time): 02/12/2016 9:52:02 AM Confirm and document reason for call. If symptomatic, describe symptoms. You must click the next button to save text entered. ---Caller states she's dizzy, she has a numbness feeling come over her head. 151/91 BP. Has the patient traveled out of the country within the last 30 days? ---No Does the patient have any new or worsening symptoms? ---Yes Will a triage be completed? ---Yes Related visit to physician within the last 2 weeks? ---No Does the PT have any chronic conditions? (i.e. diabetes, asthma, etc.) ---No Is the patient pregnant or possibly pregnant? (Ask all females between the ages of 1012-55) ---No Is this a behavioral health or substance abuse call? ---No Guidelines Guideline Title Affirmed Question Affirmed Notes Dizziness - Lightheadedness [1] MODERATE dizziness (e.g., interferes with normal activities) AND [2] has NOT been evaluated by physician for this (Exception: dizziness caused by heat exposure, sudden standing, or poor fluid intake) High Blood Pressure [1] BP # 140/90 AND [2] not taking BP medications Final Disposition User See Physician within 24 Hours Debera Latalston, RN, [employer] Referrals REFERRED TO PCP OFFICE Disagree/Comply: Comply Disagree/Comply: Comply

## 2016-02-12 NOTE — Telephone Encounter (Signed)
Reason for call: pt called in stating lightheaded. States her BP normally 90s/60s and has been 100-something/91 (did not remember the top number). Pt does not have insurance. Advised of $72 office visit paid at time of arrival. [facility].

## 2016-05-02 ENCOUNTER — Ambulatory Visit (INDEPENDENT_AMBULATORY_CARE_PROVIDER_SITE_OTHER): Payer: Self-pay | Admitting: Family

## 2016-05-02 ENCOUNTER — Encounter: Payer: Self-pay | Admitting: Family

## 2016-05-02 ENCOUNTER — Telehealth: Payer: Self-pay | Admitting: Family

## 2016-05-02 VITALS — BP 102/68 | HR 71 | Temp 97.5°F | Resp 16 | Ht 67.0 in | Wt 162.0 lb

## 2016-05-02 DIAGNOSIS — I493 Ventricular premature depolarization: Secondary | ICD-10-CM | POA: Insufficient documentation

## 2016-05-02 DIAGNOSIS — Z5181 Encounter for therapeutic drug level monitoring: Secondary | ICD-10-CM

## 2016-05-02 DIAGNOSIS — F445 Conversion disorder with seizures or convulsions: Secondary | ICD-10-CM

## 2016-05-02 DIAGNOSIS — R635 Abnormal weight gain: Secondary | ICD-10-CM

## 2016-05-02 DIAGNOSIS — F331 Major depressive disorder, recurrent, moderate: Secondary | ICD-10-CM

## 2016-05-02 LAB — BASIC METABOLIC PANEL
BUN: 14 mg/dL (ref 6–23)
CHLORIDE: 104 meq/L (ref 96–112)
CO2: 29 meq/L (ref 19–32)
Calcium: 9.5 mg/dL (ref 8.4–10.5)
Creatinine, Ser: 0.94 mg/dL (ref 0.40–1.20)
GFR: 66.77 mL/min (ref >60.00)
GLUCOSE: 85 mg/dL (ref 70–99)
POTASSIUM: 4.3 meq/L (ref 3.5–5.1)
SODIUM: 138 meq/L (ref 135–145)

## 2016-05-02 LAB — TSH: TSH: 0.76 u[IU]/mL (ref 0.35–4.50)

## 2016-05-02 MED ORDER — FLUOXETINE HCL 20 MG PO TABS: ORAL_TABLET | ORAL | 0 refills | Status: DC

## 2016-05-02 MED ORDER — TOPIRAMATE 100 MG PO TABS: 100.0000 mg | ORAL_TABLET | Freq: Two times a day (BID) | ORAL | 3 refills | Status: DC

## 2016-05-02 MED ORDER — CITALOPRAM HYDROBROMIDE 20 MG PO TABS: ORAL_TABLET | ORAL | 1 refills | Status: DC
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
## 2016-05-02 NOTE — Telephone Encounter (Signed)
Pt states she talked to her daughter and she thinks citalopram was prescribed before by neurology and she was crying non stop on it. She said she was changed to something else by her psychiatrist but she doesn't remember what it was. She is thinking she should not take the citalopram. Please call.  [phone] [place]

## 2016-05-02 NOTE — Progress Notes (Signed)
Pre visit review using our clinic review tool, if applicable. No additional management support is needed unless otherwise documented below in the visit note. 

## 2016-05-02 NOTE — Assessment & Plan Note (Signed)
Uncontrolled. Initially gave rx for citalopram but pt later contacted us and told us that she thinks it made her cry. Resent rx for prozac. Begin prozac 1/2 tab once daily for 1 week, then increase to a full tab once daily on week two for depression.  Pt was advised as follows:   Please go to the ER if you develop thoughts of hurting yourself or others.

## 2016-05-02 NOTE — Assessment & Plan Note (Signed)
Controlled on toprol xl, continue same.

## 2016-05-02 NOTE — Telephone Encounter (Signed)
D/C citalopram. rx sent for prozac instead.

## 2016-05-02 NOTE — Assessment & Plan Note (Signed)
Refill topamax, obtain bmet to assess bicarb. Will request records from neurology. I did give her info to apply for [facility] patient assistance.

## 2016-05-02 NOTE — Patient Instructions (Signed)
Please complete lab work prior to leaving. Begin topamax for seizures, Begin citalopram 1/2 tab once daily for 1 week, then increase to a full tab once daily on week two for depression. Please go to the ER if you develop thoughts of hurting yourself or others.  Contact the [facility] office to apply for patient assistance program to help with your medical costs:  [phone]

## 2016-05-03 ENCOUNTER — Encounter: Payer: Self-pay | Admitting: Family

## 2016-05-04 NOTE — Telephone Encounter (Signed)
Attempted to reach pt and left message I would send to her via mychart. Message sent.

## 2016-05-10 ENCOUNTER — Telehealth: Payer: Self-pay | Admitting: Family

## 2016-05-10 NOTE — Telephone Encounter (Signed)
°  Relationship to patient: Self  Can be reached: [phone]   Pharmacy:  [facility] [place], [place] [id number] [phone] (Phone) [phone] (Fax)     Reason for call: Request refill on metoprolol succinate (TOPROL-XL) 25 MG 24 hr tablet [[id number]][[id number]]  And   FLUoxetine (PROZAC) 20 MG tablet [[id number]][[id number]]

## 2016-05-11 MED ORDER — FLUOXETINE HCL 10 MG PO CAPS: ORAL_CAPSULE | ORAL | 0 refills | Status: DC

## 2016-05-11 MED ORDER — METOPROLOL SUCCINATE ER 25 MG PO TB24: 25.0000 mg | ORAL_TABLET | Freq: Every evening | ORAL | 0 refills | Status: AC

## 2016-05-11 NOTE — Telephone Encounter (Signed)
Spoke with pt. She has not been to cardiologist in some time and states she cannot afford to see a specialist. Also states pharmacy told her prozac capsules would be cheaper than the tablets (> $100). Spoke with pharmacist. Will send rx for 10mg  cap, take 1 a day x 1 week then increase to 2 capsules daily ($8 for 60). If pt stays on 20mg  dose can do 20mg  once a day, #30 and will be $4 going forward. Rxs sent.  Melissa-- Please advise if we will be managing metoprolol going forward as I only sent a 30 day supply at this time?

## 2016-05-15 NOTE — Telephone Encounter (Signed)
Yes, we will refill toprol. Thanks.

## 2016-06-03 ENCOUNTER — Ambulatory Visit: Payer: Self-pay | Admitting: Family

## 2016-06-15 ENCOUNTER — Telehealth: Payer: Self-pay | Admitting: Family

## 2016-06-15 NOTE — Telephone Encounter (Signed)
1 month supply of prozac sent to pharmacy. Pt is past due for follow up and will need to be seen before further refills will be given.  Please call pt to schedule appt soon with Melissa. Thanks!

## 2016-06-16 ENCOUNTER — Telehealth: Payer: Self-pay | Admitting: Family

## 2016-06-16 NOTE — Telephone Encounter (Signed)
Called pt, unable to get through. Sent pt a My chart message informing her that an fu appt is needed.

## 2016-07-12 ENCOUNTER — Telehealth: Payer: Self-pay | Admitting: Family

## 2016-07-12 NOTE — Telephone Encounter (Signed)
Please contact patient to arrange ER follow up.

## 2016-07-13 NOTE — Telephone Encounter (Signed)
Shiquita-can you contact Pt to schedule ED follow-up w/ PCP. Thank you.

## 2016-07-14 NOTE — Telephone Encounter (Signed)
Called pt. lvm informing pt to schedule a ED follow up w/ pcp per pcp

## 2016-07-15 NOTE — Telephone Encounter (Signed)
Error

## 2016-09-07 ENCOUNTER — Other Ambulatory Visit: Payer: Self-pay | Admitting: Family

## 2016-09-09 ENCOUNTER — Other Ambulatory Visit: Payer: Self-pay | Admitting: Family

## 2016-09-13 ENCOUNTER — Telehealth: Payer: Self-pay | Admitting: Family

## 2016-09-13 MED ORDER — FLUOXETINE HCL 10 MG PO CAPS: ORAL_CAPSULE | ORAL | 0 refills | Status: AC

## 2016-09-13 NOTE — Telephone Encounter (Signed)
Patient scheduled with PCP 10/14/2016

## 2016-09-13 NOTE — Telephone Encounter (Signed)
30 day supply sent. Pt overdue for appt w/ Melissa. Please schedule at Pt's convenience. Thank you.

## 2016-09-13 NOTE — Telephone Encounter (Signed)
Relation to ZO:XWRUpt:self Call back number:[phone] [facility] [place], [place] [id number] [phone] (Phone) [phone] (Fax)    Reason for call:  Patient requesting a refill FLUoxetine (PROZAC) 10 MG capsule

## 2016-10-14 ENCOUNTER — Ambulatory Visit: Payer: Self-pay | Admitting: Family

## 2016-12-29 ENCOUNTER — Other Ambulatory Visit: Payer: Self-pay | Admitting: Family

## 2016-12-30 NOTE — Telephone Encounter (Signed)
lvm advising patient of message below °

## 2016-12-30 NOTE — Telephone Encounter (Signed)
Requested drug refills are authorized 30-day Only, however, the patient needs further evaluation and/or laboratory testing before further refills are given. Ask her to make an appointment for this.  Please call patient and schedule OV prior to future refill authorization per provider/SLS 04/13

## 2017-10-10 ENCOUNTER — Telehealth: Payer: Self-pay | Admitting: Family

## 2017-10-10 NOTE — Telephone Encounter (Signed)
Copied from CRM [id number]. Topic: General - Other >> Oct 10, 2017  8:12 AM Darletta MollLander, Lumin L wrote: Patient wants to know if Alma DownsMelissa O can refer her to a doctor in [place] that accepts medicaid?  I am unfamiliar with medicaid providers in [place]. I would recommend that she contact Medicaid for a list of providers in [place].

## 2017-10-10 NOTE — Telephone Encounter (Signed)
Attempted to reach pt and left message to check mychart. Message sent. 

## 2019-01-09 DIAGNOSIS — Z0289 Encounter for other administrative examinations: Secondary | ICD-10-CM
